# Patient Record
Sex: Female | Born: 1960 | Race: Black or African American | Hispanic: No | Marital: Married | Smoking: Never smoker
Health system: Southern US, Community
[De-identification: ages and names within clinical notes are randomized; demographics above are authoritative.]

## PROBLEM LIST (undated history)

## (undated) DIAGNOSIS — I10 Essential (primary) hypertension: Secondary | ICD-10-CM

## (undated) DIAGNOSIS — E079 Disorder of thyroid, unspecified: Secondary | ICD-10-CM

---

## 1998-05-27 ENCOUNTER — Emergency Department (HOSPITAL_COMMUNITY): Admission: EM | Admit: 1998-05-27 | Discharge: 1998-05-27 | Payer: Self-pay | Admitting: Internal Medicine

## 1998-09-23 ENCOUNTER — Emergency Department (HOSPITAL_COMMUNITY): Admission: EM | Admit: 1998-09-23 | Discharge: 1998-09-23 | Payer: Self-pay | Admitting: Emergency Medicine

## 1999-03-06 ENCOUNTER — Ambulatory Visit (HOSPITAL_COMMUNITY): Admission: RE | Admit: 1999-03-06 | Discharge: 1999-03-06 | Payer: Self-pay | Admitting: Endocrinology

## 1999-03-06 ENCOUNTER — Encounter: Payer: Self-pay | Admitting: Endocrinology

## 2000-08-31 ENCOUNTER — Emergency Department (HOSPITAL_COMMUNITY): Admission: EM | Admit: 2000-08-31 | Discharge: 2000-08-31 | Payer: Self-pay | Admitting: Emergency Medicine

## 2000-08-31 ENCOUNTER — Encounter: Admission: RE | Admit: 2000-08-31 | Discharge: 2000-08-31 | Payer: Self-pay | Admitting: Urology

## 2000-08-31 ENCOUNTER — Encounter: Payer: Self-pay | Admitting: Urology

## 2003-02-16 ENCOUNTER — Ambulatory Visit (HOSPITAL_COMMUNITY): Admission: RE | Admit: 2003-02-16 | Discharge: 2003-02-16 | Payer: Self-pay | Admitting: Otolaryngology

## 2003-02-16 ENCOUNTER — Encounter (INDEPENDENT_AMBULATORY_CARE_PROVIDER_SITE_OTHER): Payer: Self-pay | Admitting: Specialist

## 2003-02-16 ENCOUNTER — Ambulatory Visit (HOSPITAL_BASED_OUTPATIENT_CLINIC_OR_DEPARTMENT_OTHER): Admission: RE | Admit: 2003-02-16 | Discharge: 2003-02-16 | Payer: Self-pay | Admitting: Otolaryngology

## 2005-03-30 ENCOUNTER — Emergency Department (HOSPITAL_COMMUNITY): Admission: EM | Admit: 2005-03-30 | Discharge: 2005-03-30 | Payer: Self-pay | Admitting: Emergency Medicine

## 2005-03-30 ENCOUNTER — Emergency Department (HOSPITAL_COMMUNITY): Admission: EM | Admit: 2005-03-30 | Discharge: 2005-03-31 | Payer: Self-pay | Admitting: Emergency Medicine

## 2007-01-06 ENCOUNTER — Encounter: Admission: RE | Admit: 2007-01-06 | Discharge: 2007-01-06 | Payer: Self-pay | Admitting: Orthopedic Surgery

## 2008-01-04 ENCOUNTER — Encounter: Admission: RE | Admit: 2008-01-04 | Discharge: 2008-01-04 | Payer: Self-pay | Admitting: Endocrinology

## 2010-04-20 ENCOUNTER — Encounter: Payer: Self-pay | Admitting: Endocrinology

## 2010-07-17 ENCOUNTER — Other Ambulatory Visit: Payer: Self-pay | Admitting: Endocrinology

## 2010-07-17 DIAGNOSIS — E010 Iodine-deficiency related diffuse (endemic) goiter: Secondary | ICD-10-CM

## 2010-08-06 ENCOUNTER — Ambulatory Visit
Admission: RE | Admit: 2010-08-06 | Discharge: 2010-08-06 | Disposition: A | Discharge status: Home / Self Care | Payer: BC Managed Care – PPO | Source: Ambulatory Visit | Attending: Endocrinology | Admitting: Endocrinology

## 2010-08-06 DIAGNOSIS — E010 Iodine-deficiency related diffuse (endemic) goiter: Secondary | ICD-10-CM

## 2010-08-15 NOTE — Op Note (Signed)
Christy Harrington, Christy Harrington                        ACCOUNT NO.:  [id number]   MEDICAL RECORD NO.:  [id number]                   PATIENT TYPE:  AMB   LOCATION:  DSC                                  FACILITY:  [facility]   PHYSICIAN:  Christopher E. Ezzard Standing, M.D.         DATE OF BIRTH:  11/10/1960   DATE OF PROCEDURE:  DATE OF DISCHARGE:                                 OPERATIVE REPORT   PREOPERATIVE DIAGNOSIS:  Recurrent tonsillitis with history of chronic  tonsillitis.   POSTOPERATIVE DIAGNOSIS:  Recurrent tonsillitis with history of chronic  tonsillitis.   OPERATION:  Tonsillectomy.   SURGEON:  Kristine Garbe. Ezzard Standing, M.D.   ANESTHESIA:  General endotracheal.   COMPLICATIONS:  None.   BRIEF CLINICAL NOTE:  [name] is a 50 year old female who has had history of  recurrent strep with positive strep cultures.  She frequently gets sore  throats and on examination has 2+ cryptic tonsils with white debris within  tonsillar crypts.  She is taken to the operating room at this time for a  tonsillectomy.   DESCRIPTION OF PROCEDURE:  After adequate endotracheal anesthesia, the  patient received 10 mg of Decadron IV preoperatively, as well as 1 gm of  Ancef IV preoperatively.  The mouth gag was used to expose the oropharynx.  The left and right tonsils were resected from the tonsillar fossas using the  coblator.  Care was taken to preserve the anterior and posterior tonsillar  pillars, as well as the uvula.  Hemostasis was obtained with the coblator.  After obtaining adequate hemostasis, the oropharynx was irrigated with  saline.  [name] was awakened from anesthesia and transferred to recovery  postoperatively doing well.   DISPOSITION:  [name] will be observed in the night recovery care center and  discharged home in the morning on Amoxicillin suspension, 400 mg b.i.d. for  1 week, Tylenol and Lortab elixir, 1-2 tablespoons q. 4 hours p.r.n. pain.  I will have her follow up in my office in  two weeks for recheck.                                               Kristine Garbe. Ezzard Standing, M.D.    CEN/MEDQ  D:  02/16/2003  T:  02/17/2003  Job:  [id number]   cc:   Georgianne Fick, M.D.  [address]  [place]  [place] [address]  Fax: [phone]

## 2011-04-08 ENCOUNTER — Other Ambulatory Visit: Payer: Self-pay | Admitting: Internal Medicine

## 2011-04-08 DIAGNOSIS — M25562 Pain in left knee: Secondary | ICD-10-CM

## 2011-04-16 ENCOUNTER — Ambulatory Visit
Admission: RE | Admit: 2011-04-16 | Discharge: 2011-04-16 | Disposition: A | Discharge status: Home / Self Care | Payer: BC Managed Care – PPO | Source: Ambulatory Visit | Attending: Internal Medicine | Admitting: Internal Medicine

## 2011-04-16 DIAGNOSIS — M25562 Pain in left knee: Secondary | ICD-10-CM

## 2011-12-01 ENCOUNTER — Other Ambulatory Visit: Payer: Self-pay | Admitting: Endocrinology

## 2011-12-01 DIAGNOSIS — E042 Nontoxic multinodular goiter: Secondary | ICD-10-CM

## 2011-12-03 ENCOUNTER — Other Ambulatory Visit: Payer: BC Managed Care – PPO

## 2012-01-07 ENCOUNTER — Other Ambulatory Visit: Payer: BC Managed Care – PPO

## 2012-01-13 ENCOUNTER — Other Ambulatory Visit: Payer: BC Managed Care – PPO

## 2012-01-19 ENCOUNTER — Ambulatory Visit
Admission: RE | Admit: 2012-01-19 | Discharge: 2012-01-19 | Disposition: A | Discharge status: Home / Self Care | Payer: BC Managed Care – PPO | Source: Ambulatory Visit | Attending: Endocrinology | Admitting: Endocrinology

## 2012-01-19 DIAGNOSIS — E042 Nontoxic multinodular goiter: Secondary | ICD-10-CM

## 2015-02-18 ENCOUNTER — Emergency Department (HOSPITAL_COMMUNITY)
Admission: EM | Admit: 2015-02-18 | Discharge: 2015-02-19 | Disposition: A | Discharge status: Home / Self Care | Payer: BLUE CROSS/BLUE SHIELD | Attending: Emergency Medicine | Admitting: Emergency Medicine

## 2015-02-18 ENCOUNTER — Encounter (HOSPITAL_COMMUNITY): Payer: Self-pay

## 2015-02-18 DIAGNOSIS — S161XXA Strain of muscle, fascia and tendon at neck level, initial encounter: Secondary | ICD-10-CM | POA: Insufficient documentation

## 2015-02-18 DIAGNOSIS — S3992XA Unspecified injury of lower back, initial encounter: Secondary | ICD-10-CM | POA: Diagnosis not present

## 2015-02-18 DIAGNOSIS — S199XXA Unspecified injury of neck, initial encounter: Secondary | ICD-10-CM | POA: Diagnosis present

## 2015-02-18 DIAGNOSIS — Y9389 Activity, other specified: Secondary | ICD-10-CM | POA: Diagnosis not present

## 2015-02-18 DIAGNOSIS — I10 Essential (primary) hypertension: Secondary | ICD-10-CM | POA: Insufficient documentation

## 2015-02-18 DIAGNOSIS — Y92410 Unspecified street and highway as the place of occurrence of the external cause: Secondary | ICD-10-CM | POA: Diagnosis not present

## 2015-02-18 DIAGNOSIS — Z8639 Personal history of other endocrine, nutritional and metabolic disease: Secondary | ICD-10-CM | POA: Insufficient documentation

## 2015-02-18 DIAGNOSIS — Y998 Other external cause status: Secondary | ICD-10-CM | POA: Diagnosis not present

## 2015-02-18 DIAGNOSIS — S29012A Strain of muscle and tendon of back wall of thorax, initial encounter: Secondary | ICD-10-CM | POA: Insufficient documentation

## 2015-02-18 DIAGNOSIS — S29019A Strain of muscle and tendon of unspecified wall of thorax, initial encounter: Secondary | ICD-10-CM

## 2015-02-18 HISTORY — DX: Essential (primary) hypertension: I10

## 2015-02-18 HISTORY — DX: Disorder of thyroid, unspecified: E07.9
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
## 2015-02-19 ENCOUNTER — Emergency Department (HOSPITAL_COMMUNITY): Payer: BLUE CROSS/BLUE SHIELD

## 2015-02-19 MED ORDER — IBUPROFEN 800 MG PO TABS
800.0000 mg | ORAL_TABLET | Freq: Once | ORAL | Status: AC
  Administered 2015-02-19: 800 mg via ORAL
  Filled 2015-02-19: qty 1

## 2015-02-19 MED ORDER — NAPROXEN 500 MG PO TABS: 500.0000 mg | ORAL_TABLET | Freq: Two times a day (BID) | ORAL | Status: DC

## 2015-02-19 MED ORDER — METHOCARBAMOL 500 MG PO TABS
500.0000 mg | ORAL_TABLET | Freq: Once | ORAL | Status: AC
  Administered 2015-02-19: 500 mg via ORAL
  Filled 2015-02-19: qty 1

## 2015-02-19 MED ORDER — METHOCARBAMOL 500 MG PO TABS: 500.0000 mg | ORAL_TABLET | Freq: Two times a day (BID) | ORAL | Status: DC
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
## 2015-02-19 NOTE — Discharge Instructions (Signed)
1. Medications: robaxin, naproxyn, usual home medications 2. Treatment: rest, drink plenty of fluids, gentle stretching as discussed, alternate ice and heat 3. Follow Up: Please followup with your primary doctor in 3 days for discussion of your diagnoses and further evaluation after today's visit; if you do not have a primary care doctor use the resource guide provided to find one;  Return to the ER for worsening back pain, difficulty walking, loss of bowel or bladder control or other concerning symptoms    Cervical Sprain A cervical sprain is an injury in the neck in which the strong, fibrous tissues (ligaments) that connect your neck bones stretch or tear. Cervical sprains can range from mild to severe. Severe cervical sprains can cause the neck vertebrae to be unstable. This can lead to damage of the spinal cord and can result in serious nervous system problems. The amount of time it takes for a cervical sprain to get better depends on the cause and extent of the injury. Most cervical sprains heal in 1 to 3 weeks. CAUSES  Severe cervical sprains may be caused by:   Contact sport injuries (such as from football, rugby, wrestling, hockey, auto racing, gymnastics, diving, martial arts, or boxing).   Motor vehicle collisions.   Whiplash injuries. This is an injury from a sudden forward and backward whipping movement of the head and neck.  Falls.  Mild cervical sprains may be caused by:   Being in an awkward position, such as while cradling a telephone between your ear and shoulder.   Sitting in a chair that does not offer proper support.   Working at a poorly Marketing executive station.   Looking up or down for long periods of time.  SYMPTOMS   Pain, soreness, stiffness, or a burning sensation in the front, back, or sides of the neck. This discomfort may develop immediately after the injury or slowly, 24 hours or more after the injury.   Pain or tenderness directly in the middle  of the back of the neck.   Shoulder or upper back pain.   Limited ability to move the neck.   Headache.   Dizziness.   Weakness, numbness, or tingling in the hands or arms.   Muscle spasms.   Difficulty swallowing or chewing.   Tenderness and swelling of the neck.  DIAGNOSIS  Most of the time your health care provider can diagnose a cervical sprain by taking your history and doing a physical exam. Your health care provider will ask about previous neck injuries and any known neck problems, such as arthritis in the neck. X-rays may be taken to find out if there are any other problems, such as with the bones of the neck. Other tests, such as a CT scan or MRI, may also be needed.  TREATMENT  Treatment depends on the severity of the cervical sprain. Mild sprains can be treated with rest, keeping the neck in place (immobilization), and pain medicines. Severe cervical sprains are immediately immobilized. Further treatment is done to help with pain, muscle spasms, and other symptoms and may include:  Medicines, such as pain relievers, numbing medicines, or muscle relaxants.   Physical therapy. This may involve stretching exercises, strengthening exercises, and posture training. Exercises and improved posture can help stabilize the neck, strengthen muscles, and help stop symptoms from returning.  HOME CARE INSTRUCTIONS   Put ice on the injured area.   Put ice in a plastic bag.   Place a towel between your skin and the bag.  Leave the ice on for 15-20 minutes, 3-4 times a day.   If your injury was severe, you may have been given a cervical collar to wear. A cervical collar is a two-piece collar designed to keep your neck from moving while it heals.  Do not remove the collar unless instructed by your health care provider.  If you have long hair, keep it outside of the collar.  Ask your health care provider before making any adjustments to your collar. Minor adjustments  may be required over time to improve comfort and reduce pressure on your chin or on the back of your head.  Ifyou are allowed to remove the collar for cleaning or bathing, follow your health care provider's instructions on how to do so safely.  Keep your collar clean by wiping it with mild soap and water and drying it completely. If the collar you have been given includes removable pads, remove them every 1-2 days and hand wash them with soap and water. Allow them to air dry. They should be completely dry before you wear them in the collar.  If you are allowed to remove the collar for cleaning and bathing, wash and dry the skin of your neck. Check your skin for irritation or sores. If you see any, tell your health care provider.  Do not drive while wearing the collar.   Only take over-the-counter or prescription medicines for pain, discomfort, or fever as directed by your health care provider.   Keep all follow-up appointments as directed by your health care provider.   Keep all physical therapy appointments as directed by your health care provider.   Make any needed adjustments to your workstation to promote good posture.   Avoid positions and activities that make your symptoms worse.   Warm up and stretch before being active to help prevent problems.  SEEK MEDICAL CARE IF:   Your pain is not controlled with medicine.   You are unable to decrease your pain medicine over time as planned.   Your activity level is not improving as expected.  SEEK IMMEDIATE MEDICAL CARE IF:   You develop any bleeding.  You develop stomach upset.  You have signs of an allergic reaction to your medicine.   Your symptoms get worse.   You develop new, unexplained symptoms.   You have numbness, tingling, weakness, or paralysis in any part of your body.  MAKE SURE YOU:   Understand these instructions.  Will watch your condition.  Will get help right away if you are not doing well  or get worse.   This information is not intended to replace advice given to you by your health care provider. Make sure you discuss any questions you have with your health care provider.   Document Released: 01/11/2007 Document Revised: 03/21/2013 Document Reviewed: 09/21/2012 Elsevier Interactive Patient Education 2016 [employer]   Tourist information centre managerMotor Vehicle Collision It is common to have multiple bruises and sore muscles after a motor vehicle collision (MVC). These tend to feel worse for the first 24 hours. You may have the most stiffness and soreness over the first several hours. You may also feel worse when you wake up the first morning after your collision. After this point, you will usually begin to improve with each day. The speed of improvement often depends on the severity of the collision, the number of injuries, and the location and nature of these injuries. HOME CARE INSTRUCTIONS  Put ice on the injured area.  Put ice in a  plastic bag.  Place a towel between your skin and the bag.  Leave the ice on for 15-20 minutes, 3-4 times a day, or as directed by your health care provider.  Drink enough fluids to keep your urine clear or pale yellow. Do not drink alcohol.  Take a warm shower or bath once or twice a day. This will increase blood flow to sore muscles.  You may return to activities as directed by your caregiver. Be careful when lifting, as this may aggravate neck or back pain.  Only take over-the-counter or prescription medicines for pain, discomfort, or fever as directed by your caregiver. Do not use aspirin. This may increase bruising and bleeding. SEEK IMMEDIATE MEDICAL CARE IF:  You have numbness, tingling, or weakness in the arms or legs.  You develop severe headaches not relieved with medicine.  You have severe neck pain, especially tenderness in the middle of the back of your neck.  You have changes in bowel or bladder control.  There is increasing pain in any area of  the body.  You have shortness of breath, light-headedness, dizziness, or fainting.  You have chest pain.  You feel sick to your stomach (nauseous), throw up (vomit), or sweat.  You have increasing abdominal discomfort.  There is blood in your urine, stool, or vomit.  You have pain in your shoulder (shoulder strap areas).  You feel your symptoms are getting worse. MAKE SURE YOU:  Understand these instructions.  Will watch your condition.  Will get help right away if you are not doing well or get worse.   This information is not intended to replace advice given to you by your health care provider. Make sure you discuss any questions you have with your health care provider.   Document Released: 03/16/2005 Document Revised: 04/06/2014 Document Reviewed: 08/13/2010 Elsevier Interactive Patient Education [employer]

## 2015-04-04 ENCOUNTER — Other Ambulatory Visit: Payer: Self-pay | Admitting: Endocrinology

## 2015-04-04 DIAGNOSIS — E049 Nontoxic goiter, unspecified: Secondary | ICD-10-CM

## 2015-04-10 ENCOUNTER — Other Ambulatory Visit: Payer: Self-pay

## 2015-04-10 ENCOUNTER — Ambulatory Visit
Admission: RE | Admit: 2015-04-10 | Discharge: 2015-04-10 | Disposition: A | Discharge status: Home / Self Care | Payer: BLUE CROSS/BLUE SHIELD | Source: Ambulatory Visit | Attending: Endocrinology | Admitting: Endocrinology

## 2015-04-10 DIAGNOSIS — E049 Nontoxic goiter, unspecified: Secondary | ICD-10-CM

## 2015-09-13 ENCOUNTER — Other Ambulatory Visit: Payer: Self-pay | Admitting: Endocrinology

## 2015-09-13 DIAGNOSIS — R140 Abdominal distension (gaseous): Secondary | ICD-10-CM

## 2015-09-13 DIAGNOSIS — R109 Unspecified abdominal pain: Secondary | ICD-10-CM

## 2015-09-23 ENCOUNTER — Ambulatory Visit
Admission: RE | Admit: 2015-09-23 | Discharge: 2015-09-23 | Disposition: A | Discharge status: Home / Self Care | Payer: BLUE CROSS/BLUE SHIELD | Source: Ambulatory Visit | Attending: Endocrinology | Admitting: Endocrinology

## 2015-09-23 DIAGNOSIS — R140 Abdominal distension (gaseous): Secondary | ICD-10-CM

## 2015-09-23 DIAGNOSIS — R109 Unspecified abdominal pain: Secondary | ICD-10-CM

## 2016-02-18 IMAGING — US US SOFT TISSUE HEAD/NECK
1 series · 14 of 25 positions shown · non-contrast
Comparison: Multiple prior, 01/19/2012, 08/06/2010, 01/04/2008

CLINICAL DATA: 54-year-old female with a history of goiter

EXAM:
THYROID ULTRASOUND
TECHNIQUE: Ultrasound examination of the thyroid gland and adjacent soft
tissues was performed.

[Series 1: us soft tissue head/neck · 0.06mm/px · 14 of 36 slices shown]
[im 1/36]
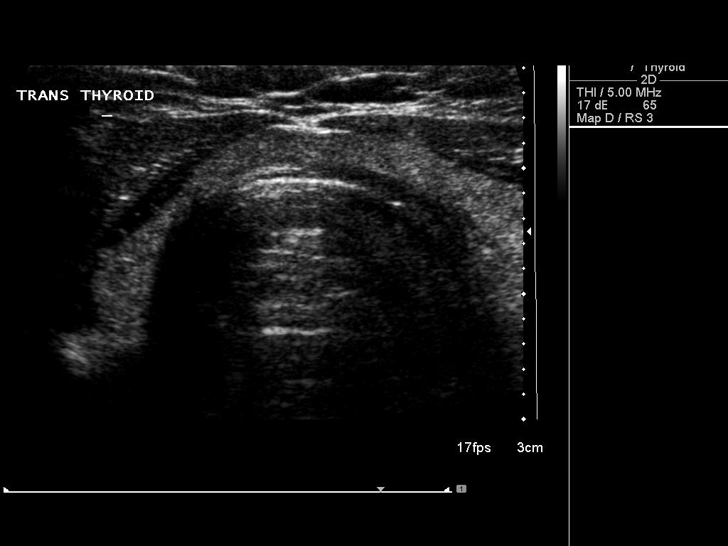
[im 3/36]
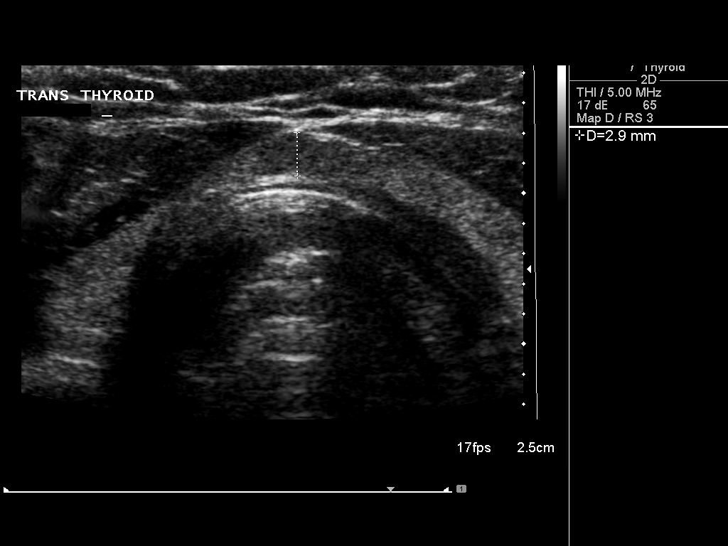
[im 6/36]
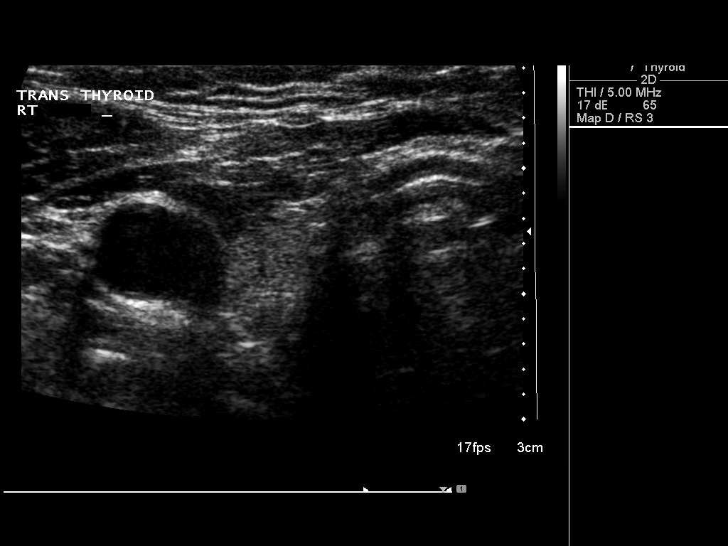
[im 9/36]
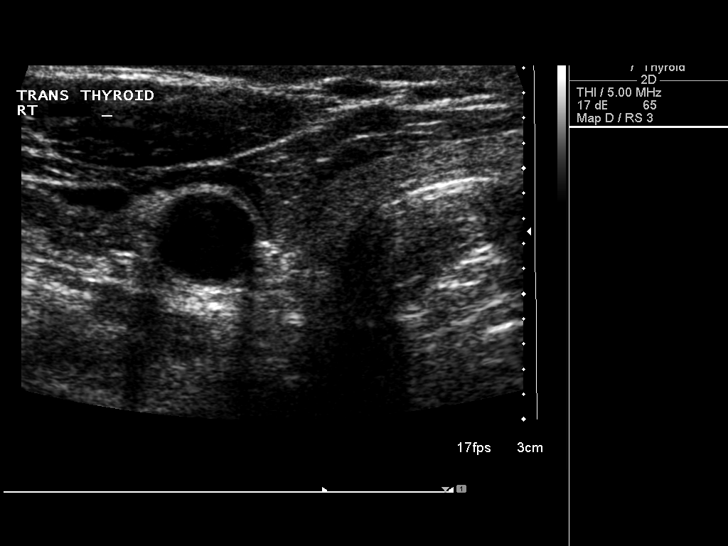
[im 12/36]
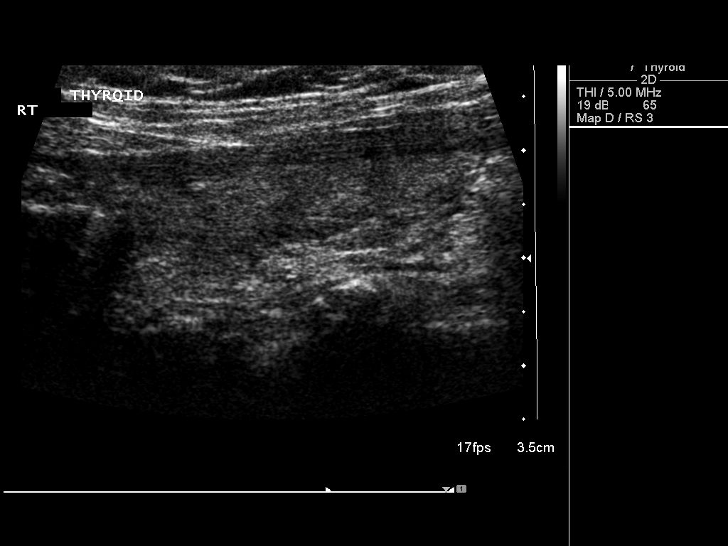
[im 14/36]
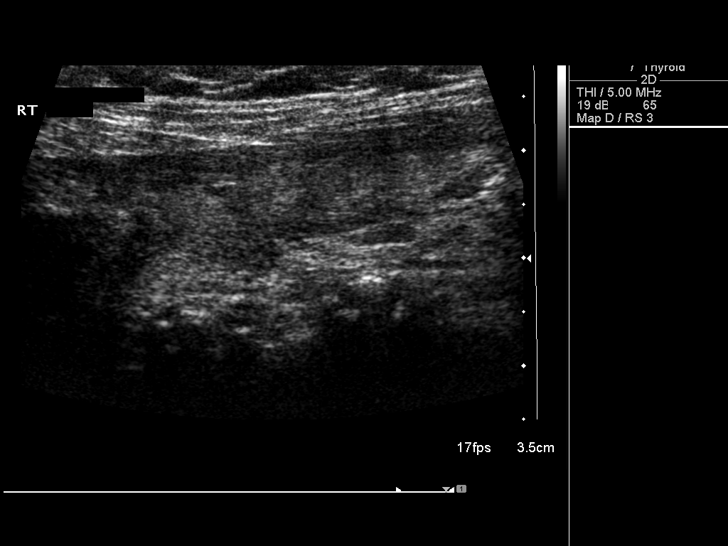
[im 17/36]
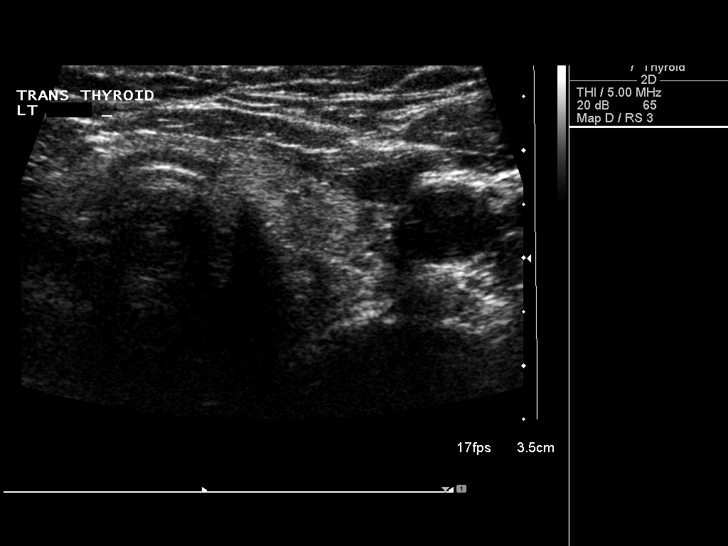
[im 19/36]
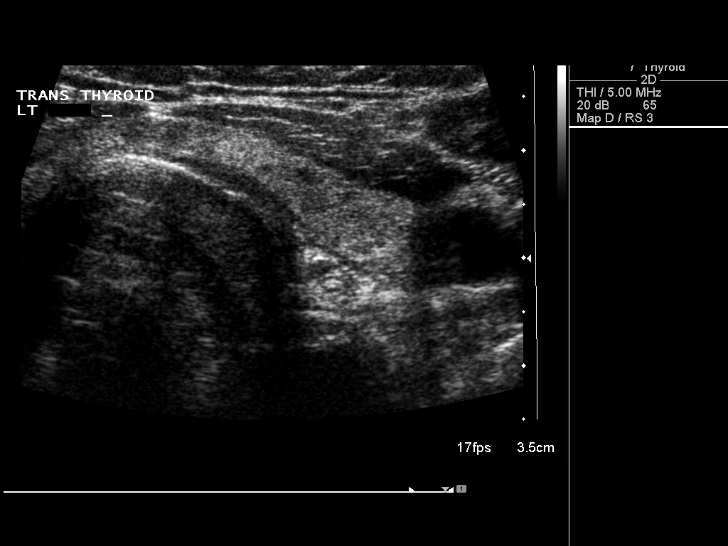
[im 22/36]
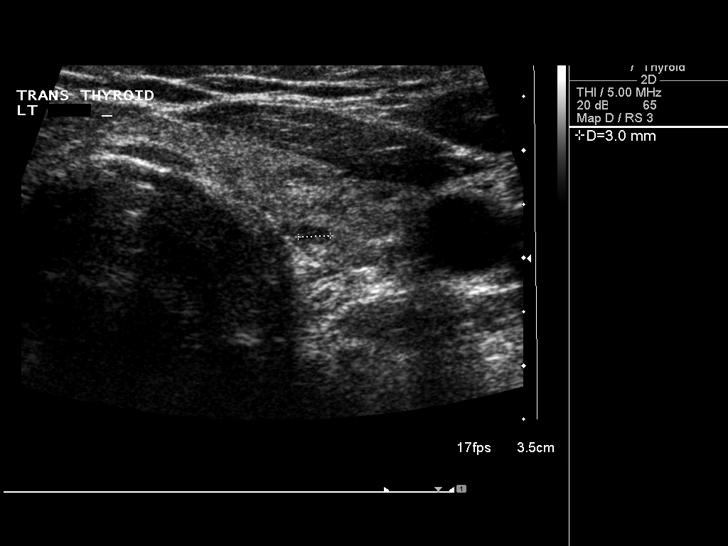
[im 24/36]
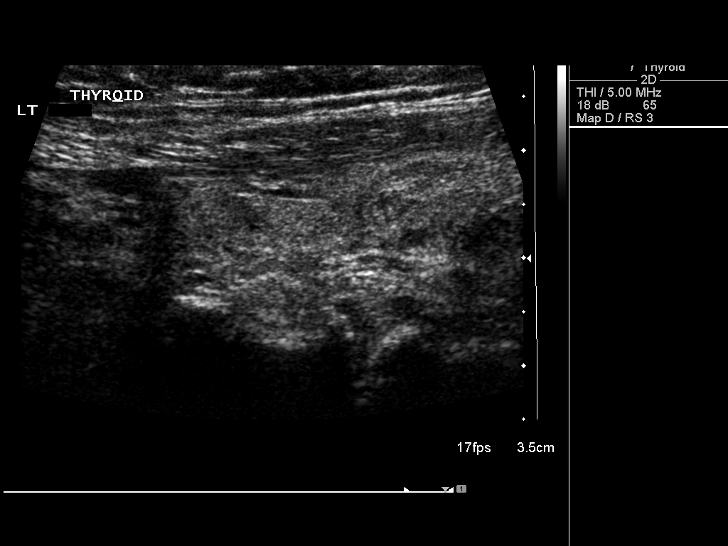
[im 27/36]
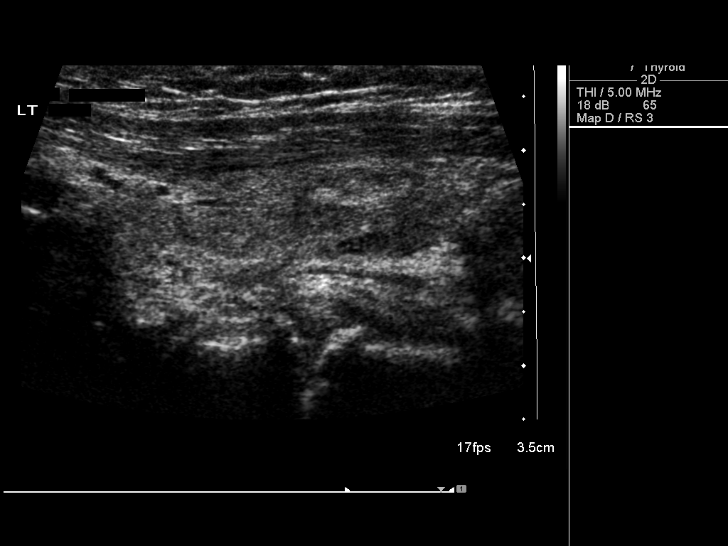
[im 30/36]
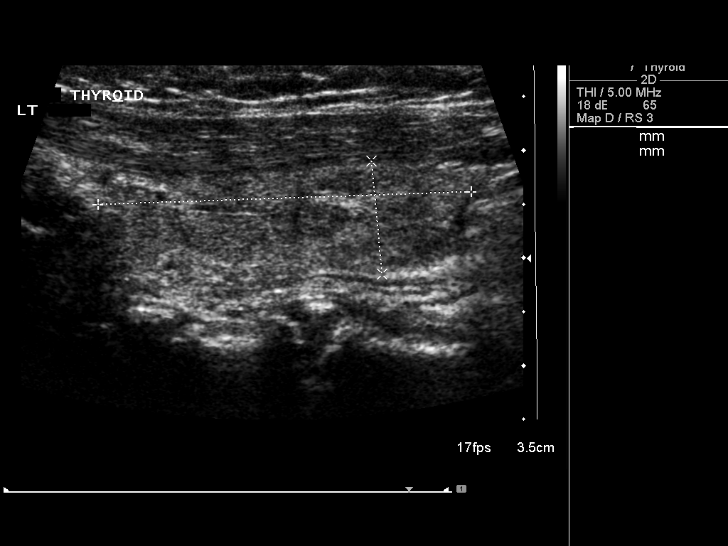
[im 33/36]
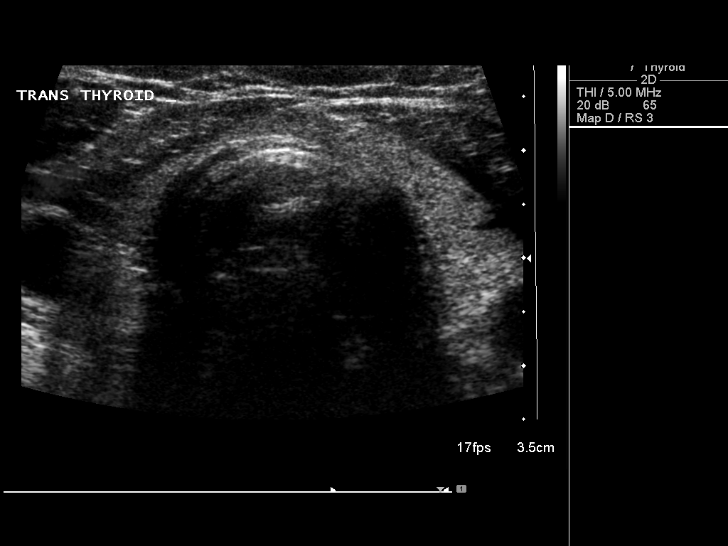
[im 36/36]
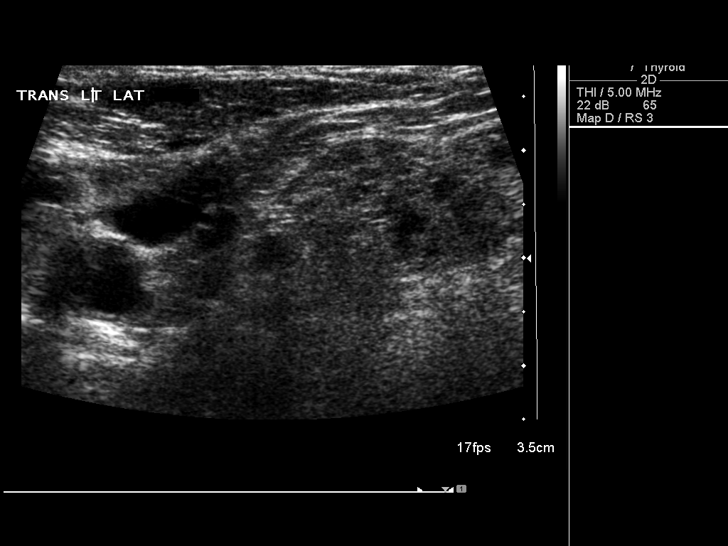

[14 of 25 positions shown; findings below may reference images not displayed]

FINDINGS: Right thyroid lobe

Measurements: 3.2 cm x 9 mm x 8 mm. Heterogeneous appearance of the
right thyroid with relatively increased flow.

Left thyroid lobe

Measurements: 3.5 cm x 1.1 cm x 1.1 cm. Heterogeneous appearance of
the left thyroid with relatively increased flow. Small benign
appearing hypoechoic nodule measuring no more than 5 mm.

Isthmus

Thickness: 3 mm.  No nodules visualized.

Lymphadenopathy

None visualized.
IMPRESSION: Heterogeneous thyroid with relatively increased flow, compatible
with spectrum of thyroiditis.

## 2016-03-25 DIAGNOSIS — Z1211 Encounter for screening for malignant neoplasm of colon: Secondary | ICD-10-CM | POA: Diagnosis not present

## 2016-03-25 DIAGNOSIS — M129 Arthropathy, unspecified: Secondary | ICD-10-CM | POA: Diagnosis not present

## 2016-03-25 DIAGNOSIS — R0781 Pleurodynia: Secondary | ICD-10-CM | POA: Diagnosis not present

## 2016-04-12 DIAGNOSIS — J069 Acute upper respiratory infection, unspecified: Secondary | ICD-10-CM | POA: Diagnosis not present

## 2016-04-22 DIAGNOSIS — E032 Hypothyroidism due to medicaments and other exogenous substances: Secondary | ICD-10-CM | POA: Diagnosis not present

## 2016-04-22 DIAGNOSIS — I10 Essential (primary) hypertension: Secondary | ICD-10-CM | POA: Diagnosis not present

## 2016-05-08 DIAGNOSIS — M19072 Primary osteoarthritis, left ankle and foot: Secondary | ICD-10-CM | POA: Diagnosis not present

## 2016-05-08 DIAGNOSIS — M71572 Other bursitis, not elsewhere classified, left ankle and foot: Secondary | ICD-10-CM | POA: Diagnosis not present

## 2016-05-08 DIAGNOSIS — M84374A Stress fracture, right foot, initial encounter for fracture: Secondary | ICD-10-CM | POA: Diagnosis not present

## 2016-05-08 DIAGNOSIS — M19071 Primary osteoarthritis, right ankle and foot: Secondary | ICD-10-CM | POA: Diagnosis not present

## 2016-05-08 DIAGNOSIS — M7661 Achilles tendinitis, right leg: Secondary | ICD-10-CM | POA: Diagnosis not present

## 2016-05-14 DIAGNOSIS — H66003 Acute suppurative otitis media without spontaneous rupture of ear drum, bilateral: Secondary | ICD-10-CM | POA: Diagnosis not present

## 2016-05-21 DIAGNOSIS — M7661 Achilles tendinitis, right leg: Secondary | ICD-10-CM | POA: Diagnosis not present

## 2016-05-21 DIAGNOSIS — M84374D Stress fracture, right foot, subsequent encounter for fracture with routine healing: Secondary | ICD-10-CM | POA: Diagnosis not present

## 2016-06-03 DIAGNOSIS — M7661 Achilles tendinitis, right leg: Secondary | ICD-10-CM | POA: Diagnosis not present

## 2016-06-03 DIAGNOSIS — M84374D Stress fracture, right foot, subsequent encounter for fracture with routine healing: Secondary | ICD-10-CM | POA: Diagnosis not present

## 2016-06-09 DIAGNOSIS — H66004 Acute suppurative otitis media without spontaneous rupture of ear drum, recurrent, right ear: Secondary | ICD-10-CM | POA: Diagnosis not present

## 2016-06-09 DIAGNOSIS — I10 Essential (primary) hypertension: Secondary | ICD-10-CM | POA: Diagnosis not present

## 2016-06-09 DIAGNOSIS — J0140 Acute pansinusitis, unspecified: Secondary | ICD-10-CM | POA: Diagnosis not present

## 2016-07-03 DIAGNOSIS — M84374D Stress fracture, right foot, subsequent encounter for fracture with routine healing: Secondary | ICD-10-CM | POA: Diagnosis not present

## 2016-07-03 DIAGNOSIS — M7661 Achilles tendinitis, right leg: Secondary | ICD-10-CM | POA: Diagnosis not present

## 2016-07-09 DIAGNOSIS — I10 Essential (primary) hypertension: Secondary | ICD-10-CM | POA: Diagnosis not present

## 2016-08-02 IMAGING — US US ABDOMEN COMPLETE
1 series · 13 of 25 positions shown · non-contrast
Comparison: No recent studies available in PACs

CLINICAL DATA: Upper abdominal pain, bloating, indigestion symptoms
for the past 3-4 weeks

EXAM:
ABDOMEN ULTRASOUND COMPLETE

[Series 1: us abdomen complete · 0.26mm/px · 13 of 74 slices shown]
[im 1/74]
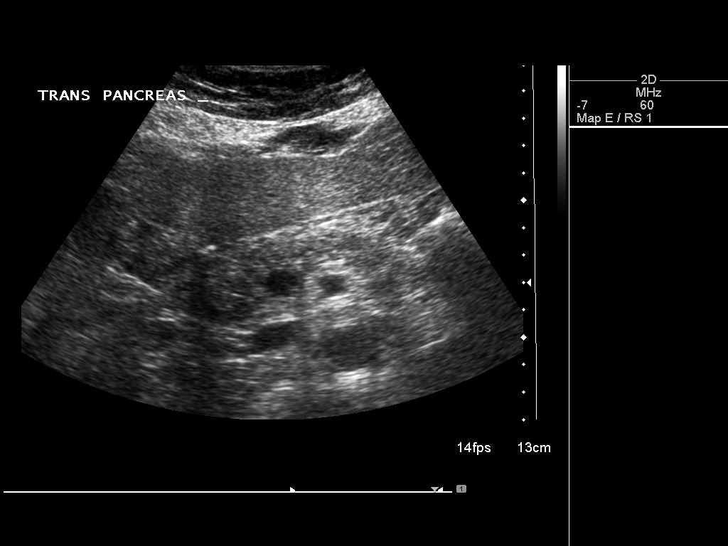
[im 7/74]
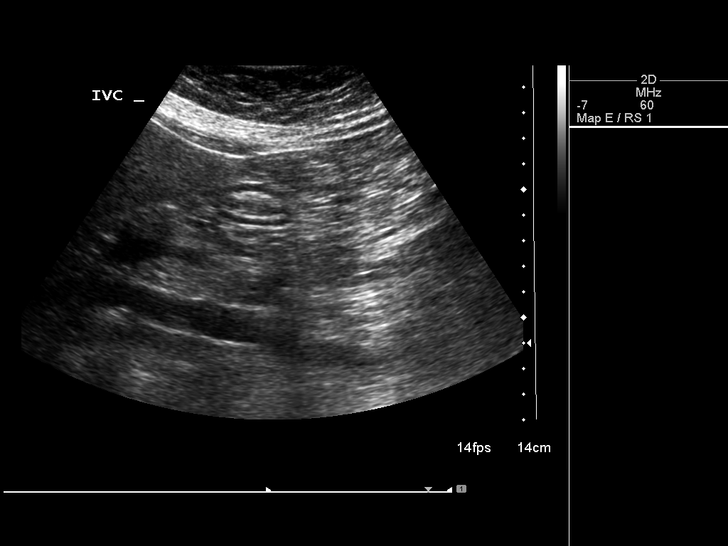
[im 13/74]
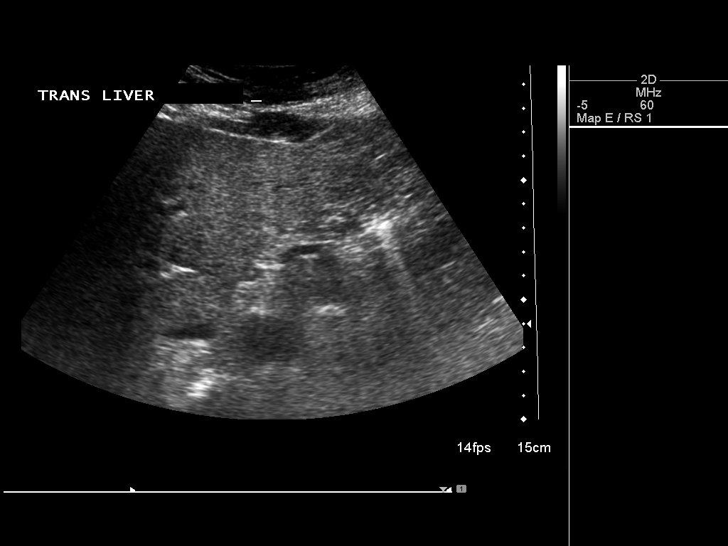
[im 19/74]
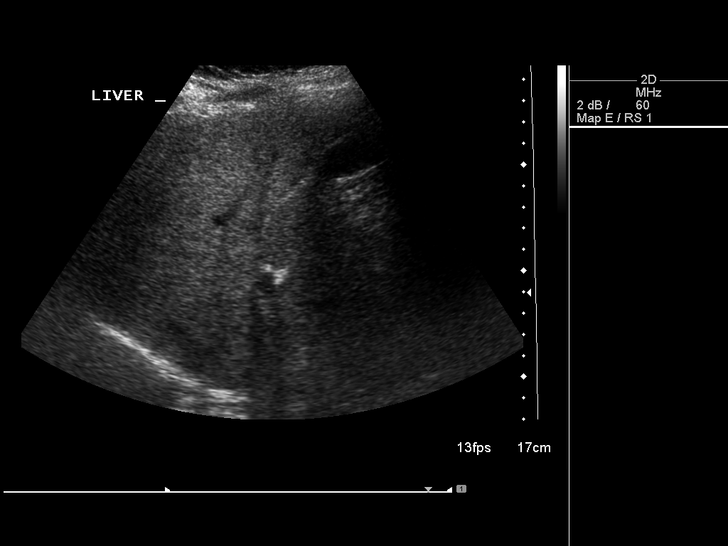
[im 25/74]
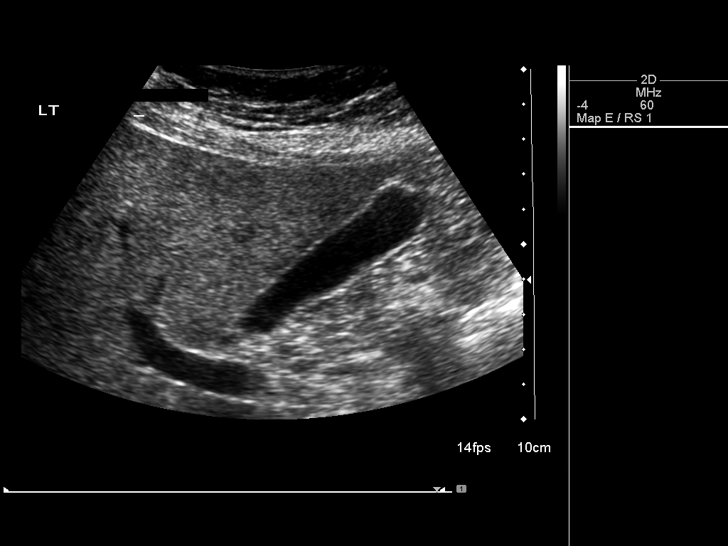
[im 31/74]
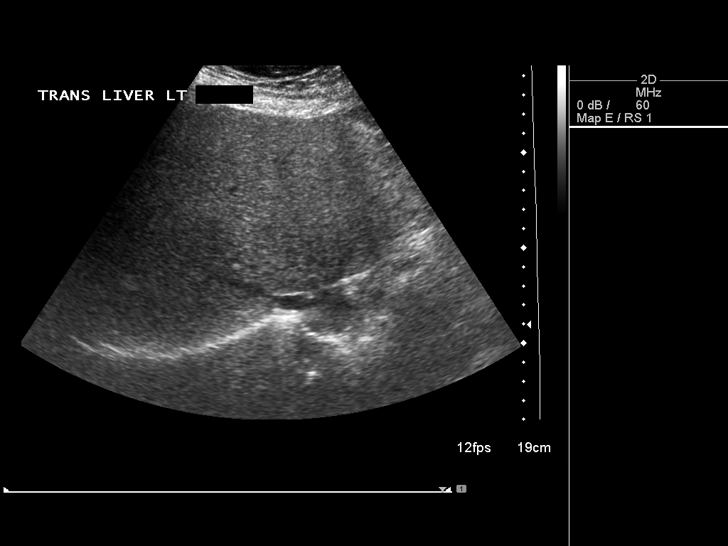
[im 37/74]
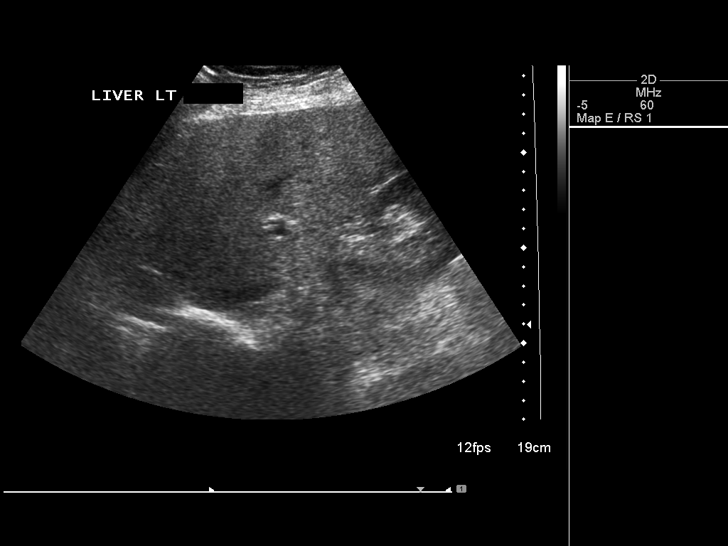
[im 43/74]
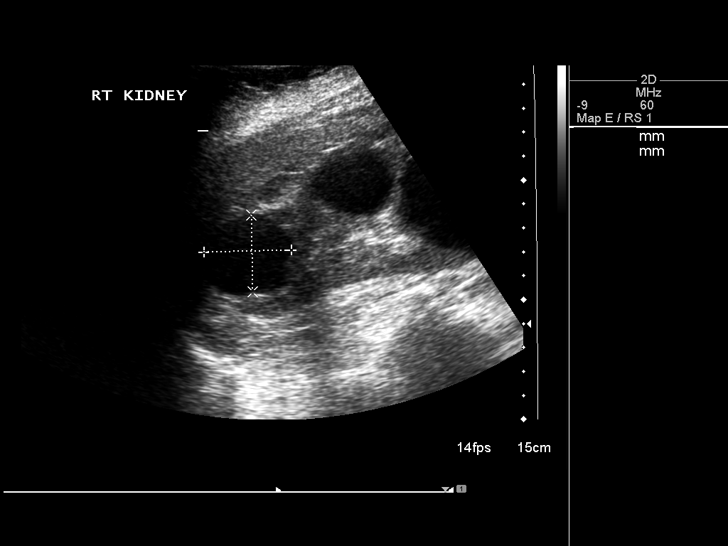
[im 49/74]
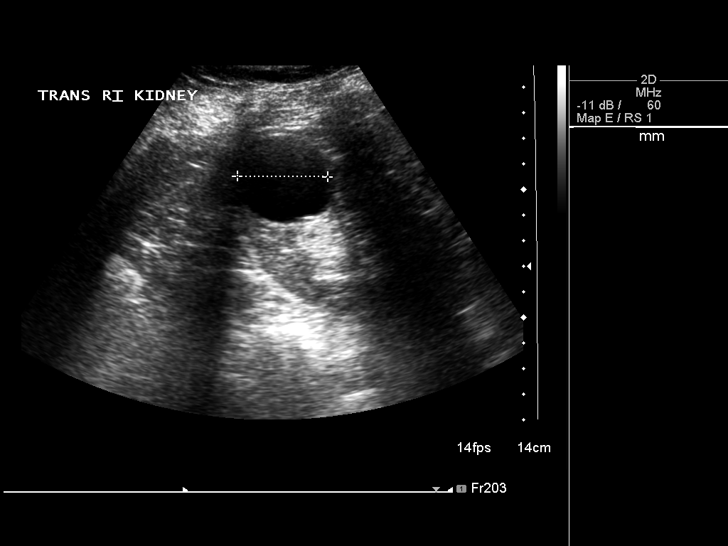
[im 55/74]
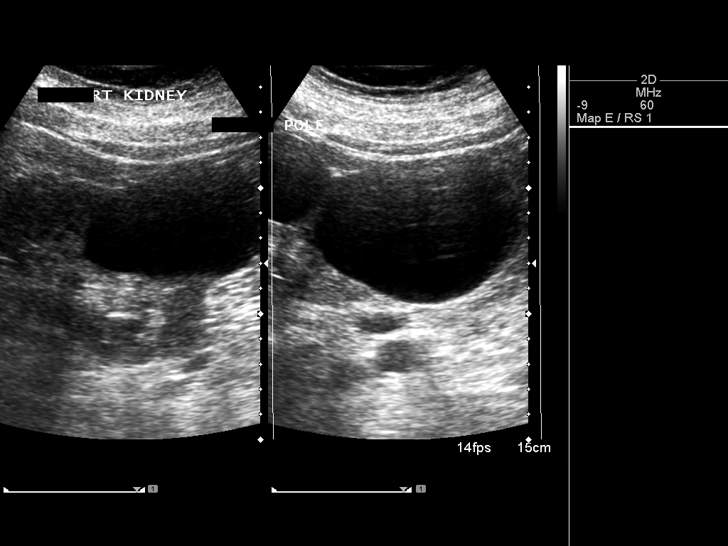
[im 61/74]
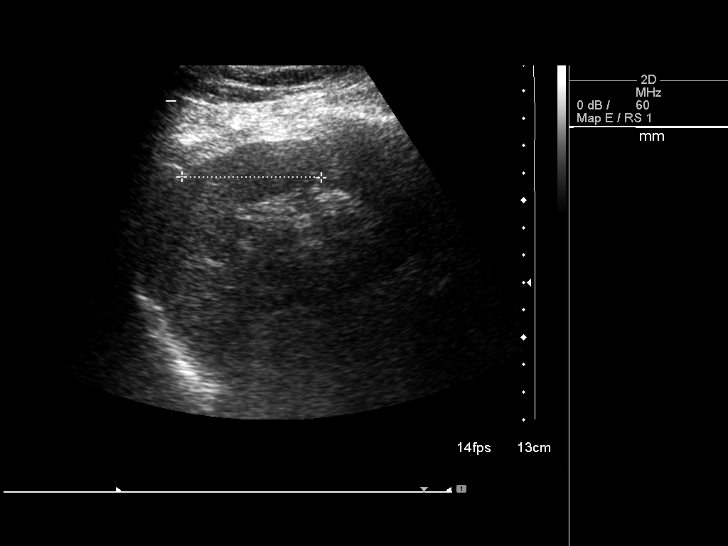
[im 67/74]
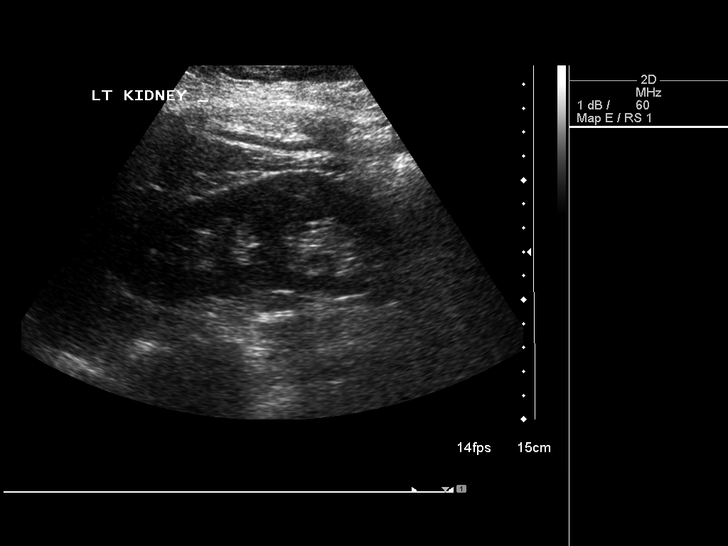
[im 74/74]
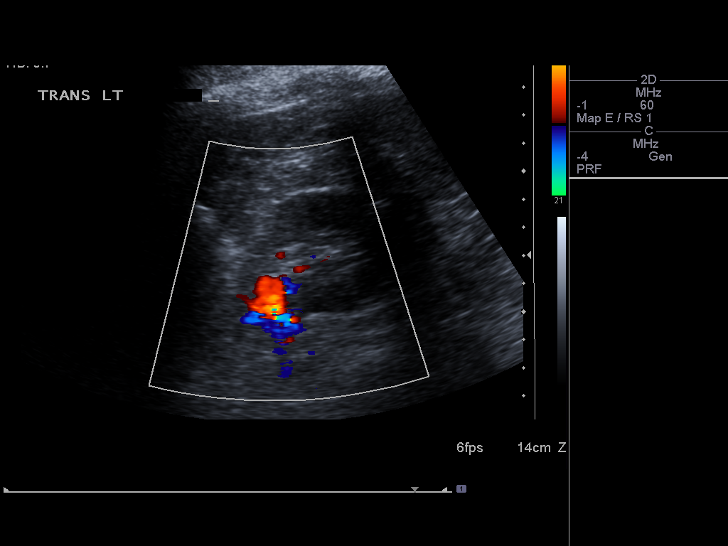

[13 of 25 positions shown; findings below may reference images not displayed]

FINDINGS: Gallbladder: No gallstones or wall thickening visualized. No
sonographic Murphy sign noted by sonographer.

Common bile duct: Diameter: 3.1 mm

Liver: The hepatic echotexture is mildly increased diffusely. There
is no focal mass nor ductal dilation. The surface contour of the
liver appears smooth.

IVC: No abnormality visualized.

Pancreas: Bowel gas limits evaluation of the pancreatic tail. The
pancreatic head and body appear normal.

Spleen: Size and appearance within normal limits.

Right Kidney: Length: 14.0. There are cysts within the right kidney.
In the upper pole there is a 3.7 cm maximal dimension cyst. In the
midpole there is a 3.9 cm maximal dimension cyst. In the lower pole
there is an 8.8 cm maximal dimension cyst.

Left Kidney: Length: 13.4 cm. Echogenicity within normal limits. No
mass or hydronephrosis visualized.

Abdominal aorta: No aneurysm visualized.

Other findings: There is no ascites.
IMPRESSION: 1. No gallstones or sonographic evidence of acute cholecystitis. If
there are clinical concerns of gallbladder dysfunction, a nuclear
medicine hepatobiliary scan may be useful.
2. Mildly increased hepatic echotexture suggests fatty infiltrative
change. Limited visualization of the pancreatic tail.
3. Simple appearing cysts in the right kidney.

## 2016-08-31 DIAGNOSIS — H53413 Scotoma involving central area, bilateral: Secondary | ICD-10-CM | POA: Diagnosis not present

## 2016-11-09 DIAGNOSIS — E039 Hypothyroidism, unspecified: Secondary | ICD-10-CM | POA: Diagnosis not present

## 2016-12-02 DIAGNOSIS — J018 Other acute sinusitis: Secondary | ICD-10-CM | POA: Diagnosis not present

## 2016-12-02 DIAGNOSIS — I10 Essential (primary) hypertension: Secondary | ICD-10-CM | POA: Diagnosis not present

## 2017-01-11 DIAGNOSIS — J018 Other acute sinusitis: Secondary | ICD-10-CM | POA: Diagnosis not present

## 2017-03-29 DIAGNOSIS — H66002 Acute suppurative otitis media without spontaneous rupture of ear drum, left ear: Secondary | ICD-10-CM | POA: Diagnosis not present

## 2017-03-29 DIAGNOSIS — A09 Infectious gastroenteritis and colitis, unspecified: Secondary | ICD-10-CM | POA: Diagnosis not present

## 2017-04-06 DIAGNOSIS — H66005 Acute suppurative otitis media without spontaneous rupture of ear drum, recurrent, left ear: Secondary | ICD-10-CM | POA: Diagnosis not present

## 2017-04-09 DIAGNOSIS — I10 Essential (primary) hypertension: Secondary | ICD-10-CM | POA: Diagnosis not present

## 2017-04-29 DIAGNOSIS — E039 Hypothyroidism, unspecified: Secondary | ICD-10-CM | POA: Diagnosis not present

## 2017-04-29 DIAGNOSIS — I10 Essential (primary) hypertension: Secondary | ICD-10-CM | POA: Diagnosis not present

## 2017-05-07 DIAGNOSIS — R110 Nausea: Secondary | ICD-10-CM | POA: Diagnosis not present

## 2017-05-07 DIAGNOSIS — I10 Essential (primary) hypertension: Secondary | ICD-10-CM | POA: Diagnosis not present

## 2017-05-12 DIAGNOSIS — E032 Hypothyroidism due to medicaments and other exogenous substances: Secondary | ICD-10-CM | POA: Diagnosis not present

## 2017-05-12 DIAGNOSIS — Z8639 Personal history of other endocrine, nutritional and metabolic disease: Secondary | ICD-10-CM | POA: Diagnosis not present

## 2017-05-17 DIAGNOSIS — I10 Essential (primary) hypertension: Secondary | ICD-10-CM | POA: Diagnosis not present

## 2017-06-04 DIAGNOSIS — I10 Essential (primary) hypertension: Secondary | ICD-10-CM | POA: Diagnosis not present

## 2017-06-04 DIAGNOSIS — R110 Nausea: Secondary | ICD-10-CM | POA: Diagnosis not present

## 2017-06-10 DIAGNOSIS — H9203 Otalgia, bilateral: Secondary | ICD-10-CM | POA: Diagnosis not present

## 2017-06-10 DIAGNOSIS — J029 Acute pharyngitis, unspecified: Secondary | ICD-10-CM | POA: Diagnosis not present

## 2017-06-10 DIAGNOSIS — H6123 Impacted cerumen, bilateral: Secondary | ICD-10-CM | POA: Diagnosis not present

## 2017-06-29 DIAGNOSIS — J209 Acute bronchitis, unspecified: Secondary | ICD-10-CM | POA: Diagnosis not present

## 2017-10-21 DIAGNOSIS — M25562 Pain in left knee: Secondary | ICD-10-CM | POA: Diagnosis not present

## 2017-10-21 DIAGNOSIS — M150 Primary generalized (osteo)arthritis: Secondary | ICD-10-CM | POA: Diagnosis not present

## 2017-10-21 DIAGNOSIS — Z6829 Body mass index (BMI) 29.0-29.9, adult: Secondary | ICD-10-CM | POA: Diagnosis not present

## 2017-11-01 DIAGNOSIS — J0140 Acute pansinusitis, unspecified: Secondary | ICD-10-CM | POA: Diagnosis not present

## 2017-11-05 DIAGNOSIS — E032 Hypothyroidism due to medicaments and other exogenous substances: Secondary | ICD-10-CM | POA: Diagnosis not present

## 2017-11-10 DIAGNOSIS — Z8639 Personal history of other endocrine, nutritional and metabolic disease: Secondary | ICD-10-CM | POA: Diagnosis not present

## 2017-11-10 DIAGNOSIS — E032 Hypothyroidism due to medicaments and other exogenous substances: Secondary | ICD-10-CM | POA: Diagnosis not present

## 2017-12-08 DIAGNOSIS — J029 Acute pharyngitis, unspecified: Secondary | ICD-10-CM | POA: Diagnosis not present

## 2017-12-08 DIAGNOSIS — I10 Essential (primary) hypertension: Secondary | ICD-10-CM | POA: Diagnosis not present

## 2017-12-08 DIAGNOSIS — R110 Nausea: Secondary | ICD-10-CM | POA: Diagnosis not present

## 2018-01-27 DIAGNOSIS — N390 Urinary tract infection, site not specified: Secondary | ICD-10-CM | POA: Diagnosis not present

## 2018-01-27 DIAGNOSIS — Z0000 Encounter for general adult medical examination without abnormal findings: Secondary | ICD-10-CM | POA: Diagnosis not present

## 2018-01-27 DIAGNOSIS — I10 Essential (primary) hypertension: Secondary | ICD-10-CM | POA: Diagnosis not present

## 2018-02-01 DIAGNOSIS — I10 Essential (primary) hypertension: Secondary | ICD-10-CM | POA: Diagnosis not present

## 2018-02-01 DIAGNOSIS — M797 Fibromyalgia: Secondary | ICD-10-CM | POA: Diagnosis not present

## 2018-02-01 DIAGNOSIS — Z0000 Encounter for general adult medical examination without abnormal findings: Secondary | ICD-10-CM | POA: Diagnosis not present

## 2018-02-01 DIAGNOSIS — Z23 Encounter for immunization: Secondary | ICD-10-CM | POA: Diagnosis not present

## 2018-02-01 DIAGNOSIS — E039 Hypothyroidism, unspecified: Secondary | ICD-10-CM | POA: Diagnosis not present

## 2018-03-09 DIAGNOSIS — J209 Acute bronchitis, unspecified: Secondary | ICD-10-CM | POA: Diagnosis not present

## 2018-03-09 DIAGNOSIS — I10 Essential (primary) hypertension: Secondary | ICD-10-CM | POA: Diagnosis not present

## 2018-03-09 DIAGNOSIS — R05 Cough: Secondary | ICD-10-CM | POA: Diagnosis not present

## 2018-04-20 DIAGNOSIS — J019 Acute sinusitis, unspecified: Secondary | ICD-10-CM | POA: Diagnosis not present

## 2018-04-20 DIAGNOSIS — J069 Acute upper respiratory infection, unspecified: Secondary | ICD-10-CM | POA: Diagnosis not present

## 2018-04-27 DIAGNOSIS — J019 Acute sinusitis, unspecified: Secondary | ICD-10-CM | POA: Diagnosis not present

## 2018-04-27 DIAGNOSIS — J069 Acute upper respiratory infection, unspecified: Secondary | ICD-10-CM | POA: Diagnosis not present

## 2018-05-13 DIAGNOSIS — E032 Hypothyroidism due to medicaments and other exogenous substances: Secondary | ICD-10-CM | POA: Diagnosis not present

## 2018-05-18 DIAGNOSIS — Z8639 Personal history of other endocrine, nutritional and metabolic disease: Secondary | ICD-10-CM | POA: Diagnosis not present

## 2018-05-18 DIAGNOSIS — E032 Hypothyroidism due to medicaments and other exogenous substances: Secondary | ICD-10-CM | POA: Diagnosis not present

## 2018-08-29 DIAGNOSIS — H66005 Acute suppurative otitis media without spontaneous rupture of ear drum, recurrent, left ear: Secondary | ICD-10-CM | POA: Diagnosis not present

## 2018-08-29 DIAGNOSIS — I10 Essential (primary) hypertension: Secondary | ICD-10-CM | POA: Diagnosis not present

## 2018-09-08 DIAGNOSIS — E876 Hypokalemia: Secondary | ICD-10-CM | POA: Diagnosis not present

## 2018-09-12 DIAGNOSIS — I10 Essential (primary) hypertension: Secondary | ICD-10-CM | POA: Diagnosis not present

## 2018-09-12 DIAGNOSIS — H66005 Acute suppurative otitis media without spontaneous rupture of ear drum, recurrent, left ear: Secondary | ICD-10-CM | POA: Diagnosis not present

## 2018-10-10 DIAGNOSIS — H66005 Acute suppurative otitis media without spontaneous rupture of ear drum, recurrent, left ear: Secondary | ICD-10-CM | POA: Diagnosis not present

## 2018-10-10 DIAGNOSIS — I10 Essential (primary) hypertension: Secondary | ICD-10-CM | POA: Diagnosis not present

## 2018-11-11 DIAGNOSIS — E032 Hypothyroidism due to medicaments and other exogenous substances: Secondary | ICD-10-CM | POA: Diagnosis not present

## 2018-11-17 DIAGNOSIS — Z8639 Personal history of other endocrine, nutritional and metabolic disease: Secondary | ICD-10-CM | POA: Diagnosis not present

## 2018-11-17 DIAGNOSIS — E032 Hypothyroidism due to medicaments and other exogenous substances: Secondary | ICD-10-CM | POA: Diagnosis not present

## 2018-12-15 DIAGNOSIS — M25562 Pain in left knee: Secondary | ICD-10-CM | POA: Diagnosis not present

## 2018-12-15 DIAGNOSIS — M150 Primary generalized (osteo)arthritis: Secondary | ICD-10-CM | POA: Diagnosis not present

## 2019-04-05 DIAGNOSIS — J019 Acute sinusitis, unspecified: Secondary | ICD-10-CM | POA: Diagnosis not present

## 2019-06-01 DIAGNOSIS — Z8639 Personal history of other endocrine, nutritional and metabolic disease: Secondary | ICD-10-CM | POA: Diagnosis not present

## 2019-06-01 DIAGNOSIS — E032 Hypothyroidism due to medicaments and other exogenous substances: Secondary | ICD-10-CM | POA: Diagnosis not present

## 2019-06-29 DIAGNOSIS — J0101 Acute recurrent maxillary sinusitis: Secondary | ICD-10-CM | POA: Diagnosis not present

## 2019-06-29 DIAGNOSIS — H66006 Acute suppurative otitis media without spontaneous rupture of ear drum, recurrent, bilateral: Secondary | ICD-10-CM | POA: Diagnosis not present

## 2019-08-03 DIAGNOSIS — M150 Primary generalized (osteo)arthritis: Secondary | ICD-10-CM | POA: Diagnosis not present

## 2019-08-03 DIAGNOSIS — M25562 Pain in left knee: Secondary | ICD-10-CM | POA: Diagnosis not present

## 2019-10-28 DIAGNOSIS — Z20822 Contact with and (suspected) exposure to covid-19: Secondary | ICD-10-CM | POA: Diagnosis not present

## 2019-11-30 DIAGNOSIS — E032 Hypothyroidism due to medicaments and other exogenous substances: Secondary | ICD-10-CM | POA: Diagnosis not present

## 2019-12-12 DIAGNOSIS — Z8639 Personal history of other endocrine, nutritional and metabolic disease: Secondary | ICD-10-CM | POA: Diagnosis not present

## 2019-12-12 DIAGNOSIS — E032 Hypothyroidism due to medicaments and other exogenous substances: Secondary | ICD-10-CM | POA: Diagnosis not present

## 2020-01-31 DIAGNOSIS — E032 Hypothyroidism due to medicaments and other exogenous substances: Secondary | ICD-10-CM | POA: Diagnosis not present

## 2020-01-31 DIAGNOSIS — R7309 Other abnormal glucose: Secondary | ICD-10-CM | POA: Diagnosis not present

## 2020-01-31 DIAGNOSIS — Z0000 Encounter for general adult medical examination without abnormal findings: Secondary | ICD-10-CM | POA: Diagnosis not present

## 2020-01-31 DIAGNOSIS — I10 Essential (primary) hypertension: Secondary | ICD-10-CM | POA: Diagnosis not present

## 2020-02-06 DIAGNOSIS — R300 Dysuria: Secondary | ICD-10-CM | POA: Diagnosis not present

## 2020-02-13 DIAGNOSIS — R059 Cough, unspecified: Secondary | ICD-10-CM | POA: Diagnosis not present

## 2020-02-13 DIAGNOSIS — R300 Dysuria: Secondary | ICD-10-CM | POA: Diagnosis not present

## 2020-02-13 DIAGNOSIS — I10 Essential (primary) hypertension: Secondary | ICD-10-CM | POA: Diagnosis not present

## 2020-02-13 DIAGNOSIS — Z9109 Other allergy status, other than to drugs and biological substances: Secondary | ICD-10-CM | POA: Diagnosis not present

## 2020-02-13 DIAGNOSIS — Z0000 Encounter for general adult medical examination without abnormal findings: Secondary | ICD-10-CM | POA: Diagnosis not present

## 2020-05-08 DIAGNOSIS — M25562 Pain in left knee: Secondary | ICD-10-CM | POA: Diagnosis not present

## 2020-05-08 DIAGNOSIS — M150 Primary generalized (osteo)arthritis: Secondary | ICD-10-CM | POA: Diagnosis not present

## 2020-05-14 ENCOUNTER — Ambulatory Visit: Payer: BC Managed Care – PPO

## 2020-05-14 ENCOUNTER — Other Ambulatory Visit: Payer: Self-pay

## 2020-05-14 ENCOUNTER — Ambulatory Visit (INDEPENDENT_AMBULATORY_CARE_PROVIDER_SITE_OTHER): Payer: BC Managed Care – PPO | Admitting: Podiatry

## 2020-05-14 DIAGNOSIS — M79671 Pain in right foot: Secondary | ICD-10-CM

## 2020-05-14 DIAGNOSIS — M216X9 Other acquired deformities of unspecified foot: Secondary | ICD-10-CM

## 2020-05-14 DIAGNOSIS — M722 Plantar fascial fibromatosis: Secondary | ICD-10-CM | POA: Diagnosis not present

## 2020-05-14 DIAGNOSIS — M79672 Pain in left foot: Secondary | ICD-10-CM

## 2020-05-14 NOTE — Patient Instructions (Signed)

## 2020-06-11 ENCOUNTER — Ambulatory Visit (INDEPENDENT_AMBULATORY_CARE_PROVIDER_SITE_OTHER): Payer: BC Managed Care – PPO | Admitting: Podiatry

## 2020-06-11 DIAGNOSIS — Z5329 Procedure and treatment not carried out because of patient's decision for other reasons: Secondary | ICD-10-CM

## 2020-06-11 NOTE — Progress Notes (Signed)
No show for appt. 

## 2020-06-27 DIAGNOSIS — I10 Essential (primary) hypertension: Secondary | ICD-10-CM | POA: Diagnosis not present

## 2020-06-27 DIAGNOSIS — E032 Hypothyroidism due to medicaments and other exogenous substances: Secondary | ICD-10-CM | POA: Diagnosis not present

## 2020-07-04 DIAGNOSIS — E032 Hypothyroidism due to medicaments and other exogenous substances: Secondary | ICD-10-CM | POA: Diagnosis not present

## 2020-07-04 DIAGNOSIS — Z8639 Personal history of other endocrine, nutritional and metabolic disease: Secondary | ICD-10-CM | POA: Diagnosis not present

## 2020-07-21 ENCOUNTER — Encounter (HOSPITAL_COMMUNITY): Payer: Self-pay | Admitting: Emergency Medicine

## 2020-07-21 ENCOUNTER — Other Ambulatory Visit: Payer: Self-pay

## 2020-07-21 ENCOUNTER — Emergency Department (HOSPITAL_COMMUNITY): Payer: BLUE CROSS/BLUE SHIELD

## 2020-07-21 ENCOUNTER — Emergency Department (HOSPITAL_COMMUNITY)
Admission: EM | Admit: 2020-07-21 | Discharge: 2020-07-21 | Disposition: A | Discharge status: Home / Self Care | Payer: BLUE CROSS/BLUE SHIELD | Attending: Emergency Medicine | Admitting: Emergency Medicine

## 2020-07-21 DIAGNOSIS — T7840XA Allergy, unspecified, initial encounter: Secondary | ICD-10-CM | POA: Insufficient documentation

## 2020-07-21 DIAGNOSIS — L509 Urticaria, unspecified: Secondary | ICD-10-CM | POA: Insufficient documentation

## 2020-07-21 DIAGNOSIS — I10 Essential (primary) hypertension: Secondary | ICD-10-CM | POA: Insufficient documentation

## 2020-07-21 DIAGNOSIS — R110 Nausea: Secondary | ICD-10-CM | POA: Insufficient documentation

## 2020-07-21 DIAGNOSIS — X58XXXA Exposure to other specified factors, initial encounter: Secondary | ICD-10-CM | POA: Insufficient documentation

## 2020-07-21 DIAGNOSIS — J029 Acute pharyngitis, unspecified: Secondary | ICD-10-CM | POA: Diagnosis not present

## 2020-07-21 DIAGNOSIS — E876 Hypokalemia: Secondary | ICD-10-CM | POA: Diagnosis not present

## 2020-07-21 DIAGNOSIS — R0602 Shortness of breath: Secondary | ICD-10-CM | POA: Insufficient documentation

## 2020-07-21 DIAGNOSIS — Z79899 Other long term (current) drug therapy: Secondary | ICD-10-CM | POA: Insufficient documentation

## 2020-07-21 LAB — CBC WITH DIFFERENTIAL/PLATELET
Abs Immature Granulocytes: 0.01 10*3/uL (ref 0.00–0.07)
Basophils Absolute: 0 10*3/uL (ref 0.0–0.1)
Basophils Relative: 1 %
Eosinophils Absolute: 0.1 10*3/uL (ref 0.0–0.5)
Eosinophils Relative: 2 %
HCT: 42.4 % (ref 36.0–46.0)
Hemoglobin: 14.6 g/dL (ref 12.0–15.0)
Immature Granulocytes: 0 %
Lymphocytes Relative: 47 %
Lymphs Abs: 2.5 10*3/uL (ref 0.7–4.0)
MCH: 29.6 pg (ref 26.0–34.0)
MCHC: 34.4 g/dL (ref 30.0–36.0)
MCV: 86 fL (ref 80.0–100.0)
Monocytes Absolute: 0.3 10*3/uL (ref 0.1–1.0)
Monocytes Relative: 6 %
Neutro Abs: 2.3 10*3/uL (ref 1.7–7.7)
Neutrophils Relative %: 44 %
Platelets: 374 10*3/uL (ref 150–400)
RBC: 4.93 MIL/uL (ref 3.87–5.11)
RDW: 11.8 % (ref 11.5–15.5)
WBC: 5.2 10*3/uL (ref 4.0–10.5)
nRBC: 0 % (ref 0.0–0.2)

## 2020-07-21 LAB — BASIC METABOLIC PANEL
Anion gap: 10 (ref 5–15)
BUN: 5 mg/dL — ABNORMAL LOW (ref 8–23)
CO2: 25 mmol/L (ref 22–32)
Calcium: 10.3 mg/dL (ref 8.9–10.3)
Chloride: 106 mmol/L (ref 98–111)
Creatinine, Ser: 0.65 mg/dL (ref 0.44–1.00)
GFR, Estimated: >60 mL/min (ref >60)
Glucose, Bld: 99 mg/dL (ref 70–99)
Potassium: 3 mmol/L — ABNORMAL LOW (ref 3.5–5.1)
Sodium: 141 mmol/L (ref 135–145)

## 2020-07-21 MED ORDER — ONDANSETRON HCL 4 MG/2ML IJ SOLN
4.0000 mg | Freq: Once | INTRAMUSCULAR | Status: AC
  Administered 2020-07-21: 4 mg via INTRAVENOUS
  Filled 2020-07-21: qty 2

## 2020-07-21 MED ORDER — METHYLPREDNISOLONE SODIUM SUCC 125 MG IJ SOLR
125.0000 mg | Freq: Once | INTRAMUSCULAR | Status: AC
  Administered 2020-07-21: 125 mg via INTRAVENOUS
  Filled 2020-07-21: qty 2

## 2020-07-21 MED ORDER — SODIUM CHLORIDE 0.9 % IV BOLUS
1000.0000 mL | Freq: Once | INTRAVENOUS | Status: AC
  Administered 2020-07-21: 1000 mL via INTRAVENOUS

## 2020-07-21 MED ORDER — POTASSIUM CHLORIDE CRYS ER 20 MEQ PO TBCR
40.0000 meq | EXTENDED_RELEASE_TABLET | Freq: Once | ORAL | Status: AC
  Administered 2020-07-21: 40 meq via ORAL
  Filled 2020-07-21: qty 2

## 2020-07-21 MED ORDER — PREDNISONE 10 MG PO TABS: 40.0000 mg | ORAL_TABLET | Freq: Every day | ORAL | 0 refills | Status: AC

## 2020-07-21 MED ORDER — FAMOTIDINE IN NACL 20-0.9 MG/50ML-% IV SOLN
20.0000 mg | Freq: Once | INTRAVENOUS | Status: AC
  Administered 2020-07-21: 20 mg via INTRAVENOUS
  Filled 2020-07-21: qty 50

## 2020-07-21 MED ORDER — EPINEPHRINE 0.3 MG/0.3ML IJ SOAJ: 0.3000 mg | INTRAMUSCULAR | 0 refills | Status: AC | PRN

## 2020-07-21 MED ORDER — DIPHENHYDRAMINE HCL 50 MG/ML IJ SOLN
25.0000 mg | Freq: Once | INTRAMUSCULAR | Status: AC
  Administered 2020-07-21: 25 mg via INTRAVENOUS
  Filled 2020-07-21: qty 1

## 2020-07-21 NOTE — ED Triage Notes (Signed)
Pt stung on L side approx 15 min ago while gardening.  C/o sore throat and difficulty breathing.  Speaking in complete sentences.

## 2020-07-21 NOTE — ED Provider Notes (Signed)
[facility] EMERGENCY DEPARTMENT Provider Note   CSN: [id number] Arrival date & time: 07/21/20  5462     History Chief Complaint  Patient presents with  . Allergic Reaction    Christy Harrington is a 60 y.o. female.  HPI Patient is a 60 year old female with past medical history significant for hypertension and thyroid disease presented today with sore throat, some mild shortness of breath, some nausea all which came on abruptly after she was stung by some insect while she was gardening 20 minutes prior to arrival in the ER.  She states she has no history of anaphylaxis.  She states she is uncertain what stung her but she felt that she was stung twice in the left side.  She denies any vomiting or diarrhea.  No rash apart from the areas where she was stung which she states she feels a small bump.  She states she also has a somewhat sore throat.  She states that it is irritated feeling.  Denies any chest pain lightheadedness or dizziness.  No other associate symptoms.  No medications prior to arrival.     Past Medical History:  Diagnosis Date  . Hypertension   . Thyroid disease     There are no problems to display for this patient.   History reviewed. No pertinent surgical history.   OB History   No obstetric history on file.     No family history on file.  Social History   Tobacco Use  . Smoking status: Never Smoker  Substance Use Topics  . Alcohol use: No    Home Medications Prior to Admission medications   Medication Sig Start Date End Date Taking? Authorizing Provider  acetaminophen (TYLENOL) 325 MG tablet Take 650 mg by mouth every 6 (six) hours as needed for mild pain, fever or headache.   Yes [provider]  amLODipine (NORVASC) 10 MG tablet Take 10 mg by mouth daily. 06/03/20  Yes [provider]  Ascorbic Acid (VITAMIN C PO) Take 1 tablet by mouth daily.   Yes [provider]  BIOTIN PO Take 1 tablet by mouth  daily.   Yes [provider]  EPINEPHrine 0.3 mg/0.3 mL IJ SOAJ injection Inject 0.3 mg into the muscle as needed for anaphylaxis. 07/21/20  Yes Jenille Laszlo S, PA  losartan (COZAAR) 100 MG tablet Take 100 mg by mouth daily. 06/12/20  Yes [provider]  MAGNESIUM PO Take 1 tablet by mouth daily.   Yes [provider]  Multiple Vitamin (MULTIVITAMIN WITH MINERALS) TABS tablet Take 1 tablet by mouth daily.   Yes [provider]  Multiple Vitamins-Minerals (ZINC PO) Take 1 tablet by mouth daily.   Yes [provider]  polyvinyl alcohol (LIQUIFILM TEARS) 1.4 % ophthalmic solution Place 1 drop into both eyes as needed for dry eyes.   Yes [provider]  predniSONE (DELTASONE) 10 MG tablet Take 4 tablets (40 mg total) by mouth daily with breakfast for 4 days. 07/21/20 07/25/20 Yes Kawon Willcutt S, PA  SYNTHROID 88 MCG tablet Take 88 mcg by mouth every morning. 07/04/20  Yes [provider]  VITAMIN D PO Take 1 capsule by mouth daily.   Yes [provider]    Allergies    Chia seed (salvia hispanica) [salvia hispanica] and Sulfa antibiotics  Review of Systems   Review of Systems  Constitutional: Negative for chills and fever.  HENT: Positive for sore throat. Negative for congestion.   Eyes: Negative  for pain.  Respiratory: Positive for shortness of breath. Negative for cough.   Cardiovascular: Negative for chest pain and leg swelling.  Gastrointestinal: Positive for nausea. Negative for abdominal pain, diarrhea and vomiting.  Genitourinary: Negative for dysuria.  Musculoskeletal: Negative for myalgias.  Skin: Negative for rash.  Neurological: Negative for dizziness and headaches.    Physical Exam Updated Vital Signs BP 134/70 (BP Location: Right Arm)   Pulse 72   Temp 98.6 F (37 C) (Oral)   Resp 20   SpO2 99%   Physical Exam Vitals and nursing note reviewed.  Constitutional:      General: She is not in acute  distress. HENT:     Head: Normocephalic and atraumatic.     Nose: Nose normal.     Mouth/Throat:     Comments: Tongue is not swollen. Speaking normally with normal phonation Managing secretions without difficulty Eyes:     General: No scleral icterus. Cardiovascular:     Rate and Rhythm: Normal rate and regular rhythm.     Pulses: Normal pulses.     Heart sounds: Normal heart sounds.     Comments: Heart rate 96 no murmurs rubs or gallops Pulmonary:     Effort: Pulmonary effort is normal. No respiratory distress.     Breath sounds: Normal breath sounds. No wheezing.     Comments: No increased work of breathing, no tachypnea, speaking in full sentences lungs are clear to auscultation Abdominal:     Palpations: Abdomen is soft.     Tenderness: There is no abdominal tenderness.  Musculoskeletal:     Cervical back: Normal range of motion.     Right lower leg: No edema.     Left lower leg: No edema.  Skin:    General: Skin is warm and dry.     Capillary Refill: Capillary refill takes less than 2 seconds.     Comments: 2 small hives that are raised approximately 7 mm in diameter and located on the left flank.  Neurological:     Mental Status: She is alert. Mental status is at baseline.  Psychiatric:        Mood and Affect: Mood normal.        Behavior: Behavior normal.     ED Results / Procedures / Treatments   Labs (all labs ordered are listed, but only abnormal results are displayed) Labs Reviewed  BASIC METABOLIC PANEL - Abnormal; Notable for the following components:      Result Value   Potassium 3.0 (*)    BUN 5 (*)    All other components within normal limits  CBC WITH DIFFERENTIAL/PLATELET    EKG None  Radiology DG Chest Portable 1 View  Result Date: 07/21/2020 CLINICAL DATA:  Shortness of breath EXAM: PORTABLE CHEST 1 VIEW COMPARISON:  None. FINDINGS: Heart size is upper limits of normal, which may be accentuated by AP semi upright technique. No pulmonary  vascular congestion. No focal airspace consolidation, pleural effusion, or pneumothorax. No acute osseous findings. IMPRESSION: No active disease. Electronically Signed   By: Duanne Guess D.O.   On: 07/21/2020 10:45    Procedures Procedures   Medications Ordered in ED Medications  potassium chloride SA (KLOR-CON) CR tablet 40 mEq (has no administration in time range)  methylPREDNISolone sodium succinate (SOLU-MEDROL) 125 mg/2 mL injection 125 mg (125 mg Intravenous Given 07/21/20 1022)  diphenhydrAMINE (BENADRYL) injection 25 mg (25 mg Intravenous Given 07/21/20 1024)  famotidine (PEPCID) IVPB 20 mg premix (0 mg Intravenous  Stopped 07/21/20 1123)  ondansetron (ZOFRAN) injection 4 mg (4 mg Intravenous Given 07/21/20 1023)  sodium chloride 0.9 % bolus 1,000 mL (0 mLs Intravenous Stopped 07/21/20 1058)    ED Course  I have reviewed the triage vital signs and the nursing notes.  Pertinent labs & imaging results that were available during my care of the patient were reviewed by me and considered in my medical decision making (see chart for details).  Clinical Course as of 07/21/20 1320  Sun Jul 21, 2020  1010 Patient was stung 3 times in her garden by an unknown type of insect.  She states that this occurred about 20 minutes ago from this moment.  So approximately 9:50 AM.  She states she came immediately to the ER because she started feeling like she was having some trouble swallowing and feels somewhat short of breath also endorses some nausea.  On physical exam patient is breathing at a normal rate lungs are clear to auscultation with no wheezing.  No abdominal tenderness.  She does have 2 hive appearing areas to her left hip/left flank.  She is tolerating secretions without difficulty.  Will provide Solu-Medrol, Benadryl and famotidine IV fluids and obtain basic labs.  Will have low threshold for epinephrine should she worsen. [WF]    Clinical Course User Index [WF] Gailen Shelter, [place]    Patient is overall well-appearing on initial evaluation.  Did not need epinephrine at this time.  Will obtain labs, EKG and give Solu-Medrol Benadryl and Pepcid.  MDM Rules/Calculators/A&P                         CBC without leukocytosis or anemia. Potassium 3.0 this is a normal for her.  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  We will provide 1 tablet of p.o. potassium here.    She was p.o. challenged without difficulty.  Tolerating fluid well.  States she feels significantly improved.    Will discharge with epinephrine pen in case needed and she was counseled on necessity of quick return to ER for this if she had to use it.  Discharged with prednisone and Benadryl recommendations.  Strict return precautions.  Final Clinical Impression(s) / ED Diagnoses Final diagnoses:  Allergic reaction, initial encounter  Hypokalemia    Rx / DC Orders ED Discharge Orders         Ordered    EPINEPHrine 0.3 mg/0.3 mL IJ SOAJ injection  As needed        07/21/20 1307    predniSONE (DELTASONE) 10 MG tablet  Daily with breakfast        07/21/20 1307           Gailen Shelter, PA 07/21/20 1322    Rozelle Logan, DO 07/21/20 2118

## 2020-07-21 NOTE — Discharge Instructions (Addendum)
Given the severity of your reaction today and your borderline for criteria for anaphylaxis.  I am prescribing you an epinephrine pen please talk to the pharmacist about how to administer this.  Please take Benadryl 25 mg either every 6 hours or 50 mg at nighttime for the next few days as needed for itching.  Please take the prednisone I prescribed daily.  Please follow-up with your primary care provider.  I have also given you the information for an allergist in [place].  If you have any new or concerning symptoms return to ER.  If you have to administer your epinephrine pen please come to the ER immediately.

## 2020-08-02 NOTE — Progress Notes (Signed)
  Subjective:  Patient ID: Christy Harrington, female    DOB: 1961-03-17,  MRN: [id number]  Chief Complaint  Patient presents with  . Foot Pain    Bilateral foot pain. Left arch and right heel pain x 2 months.     60 y.o. female presents with the above complaint. History confirmed with patient.   Objective:  Physical Exam: warm, good capillary refill, no trophic changes or ulcerative lesions, normal DP and PT pulses and normal sensory exam. Left Foot: tenderness to palpation medial calcaneal tuber, no pain with calcaneal squeeze, decreased ankle joint ROM and +Silverskiold test Right Foot: tenderness to palpation medial calcaneal tuber, no pain with calcaneal squeeze, decreased ankle joint ROM and +Silverskiold test normal exam, no swelling, tenderness, instability; ligaments intact, full range of motion of all ankle/foot joints other than findings noted above.  Radiographs: X-ray of both feet: no evidence of calcaneal stress fracture, plantar calcaneal spur, posterior calcaneal spur and Haglund deformity noted  Assessment:   1. Plantar fasciitis   2. Equinus deformity of foot      Plan:  Patient was evaluated and treated and all questions answered.  Plantar Fasciitis -XR reviewed with patient -Educated patient on stretching and icing of the affected limb   No follow-ups on file.

## 2020-12-01 DIAGNOSIS — Z20822 Contact with and (suspected) exposure to covid-19: Secondary | ICD-10-CM | POA: Diagnosis not present

## 2020-12-01 DIAGNOSIS — J0140 Acute pansinusitis, unspecified: Secondary | ICD-10-CM | POA: Diagnosis not present

## 2020-12-01 DIAGNOSIS — Z03818 Encounter for observation for suspected exposure to other biological agents ruled out: Secondary | ICD-10-CM | POA: Diagnosis not present

## 2020-12-01 DIAGNOSIS — H60392 Other infective otitis externa, left ear: Secondary | ICD-10-CM | POA: Diagnosis not present

## 2021-01-17 DIAGNOSIS — H53413 Scotoma involving central area, bilateral: Secondary | ICD-10-CM | POA: Diagnosis not present

## 2021-01-17 DIAGNOSIS — H2513 Age-related nuclear cataract, bilateral: Secondary | ICD-10-CM | POA: Diagnosis not present

## 2021-01-31 DIAGNOSIS — H2511 Age-related nuclear cataract, right eye: Secondary | ICD-10-CM | POA: Diagnosis not present

## 2021-01-31 DIAGNOSIS — H2513 Age-related nuclear cataract, bilateral: Secondary | ICD-10-CM | POA: Diagnosis not present

## 2021-02-13 DIAGNOSIS — H66001 Acute suppurative otitis media without spontaneous rupture of ear drum, right ear: Secondary | ICD-10-CM | POA: Diagnosis not present

## 2021-03-31 DIAGNOSIS — U071 COVID-19: Secondary | ICD-10-CM | POA: Diagnosis not present

## 2021-05-31 IMAGING — DX DG CHEST 1V PORT
1 series · 1 of 1 positions shown · non-contrast
Comparison: None.

CLINICAL DATA: Shortness of breath

EXAM:
PORTABLE CHEST 1 VIEW

[chest ap]
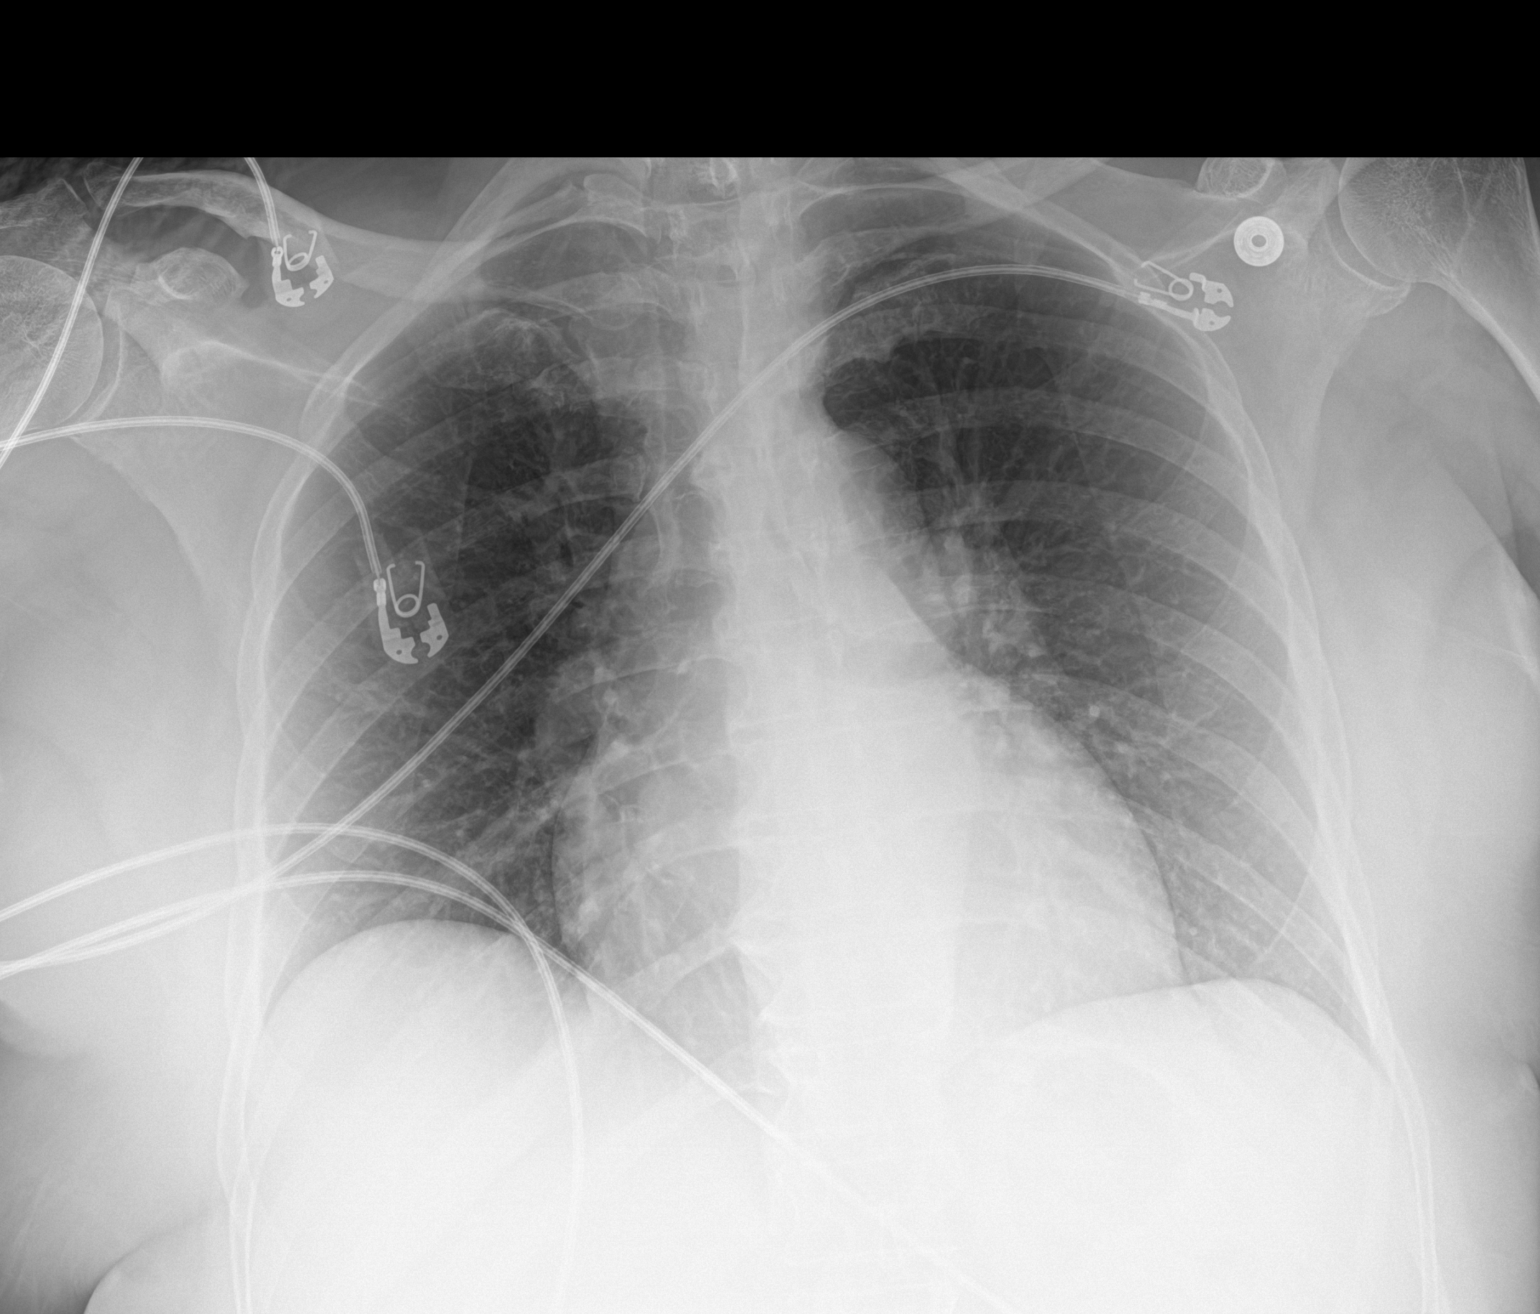

[1 of 1 positions shown; findings below may reference images not displayed]

FINDINGS: Heart size is upper limits of normal, which may be accentuated by AP
semi upright technique. No pulmonary vascular congestion. No focal
airspace consolidation, pleural effusion, or pneumothorax. No acute
osseous findings.
IMPRESSION: No active disease.

## 2023-06-09 DIAGNOSIS — Z6828 Body mass index (BMI) 28.0-28.9, adult: Secondary | ICD-10-CM | POA: Diagnosis not present

## 2023-06-09 DIAGNOSIS — I10 Essential (primary) hypertension: Secondary | ICD-10-CM | POA: Diagnosis not present

## 2023-06-09 DIAGNOSIS — E663 Overweight: Secondary | ICD-10-CM | POA: Diagnosis not present

## 2023-06-09 DIAGNOSIS — Z8639 Personal history of other endocrine, nutritional and metabolic disease: Secondary | ICD-10-CM | POA: Diagnosis not present

## 2023-06-09 DIAGNOSIS — E039 Hypothyroidism, unspecified: Secondary | ICD-10-CM | POA: Diagnosis not present

## 2023-06-09 DIAGNOSIS — Z008 Encounter for other general examination: Secondary | ICD-10-CM | POA: Diagnosis not present

## 2023-06-09 DIAGNOSIS — Z13220 Encounter for screening for lipoid disorders: Secondary | ICD-10-CM | POA: Diagnosis not present

## 2023-06-09 DIAGNOSIS — R7309 Other abnormal glucose: Secondary | ICD-10-CM | POA: Diagnosis not present

## 2023-06-16 DIAGNOSIS — Z9109 Other allergy status, other than to drugs and biological substances: Secondary | ICD-10-CM | POA: Diagnosis not present

## 2023-06-16 DIAGNOSIS — E032 Hypothyroidism due to medicaments and other exogenous substances: Secondary | ICD-10-CM | POA: Diagnosis not present

## 2023-06-16 DIAGNOSIS — Z8639 Personal history of other endocrine, nutritional and metabolic disease: Secondary | ICD-10-CM | POA: Diagnosis not present

## 2023-06-16 DIAGNOSIS — Z79899 Other long term (current) drug therapy: Secondary | ICD-10-CM | POA: Diagnosis not present

## 2023-06-16 DIAGNOSIS — Z0000 Encounter for general adult medical examination without abnormal findings: Secondary | ICD-10-CM | POA: Diagnosis not present

## 2023-06-16 DIAGNOSIS — R7309 Other abnormal glucose: Secondary | ICD-10-CM | POA: Diagnosis not present

## 2023-06-16 DIAGNOSIS — I10 Essential (primary) hypertension: Secondary | ICD-10-CM | POA: Diagnosis not present

## 2023-06-16 DIAGNOSIS — Z91030 Bee allergy status: Secondary | ICD-10-CM | POA: Diagnosis not present

## 2023-07-20 DIAGNOSIS — H6122 Impacted cerumen, left ear: Secondary | ICD-10-CM | POA: Diagnosis not present

## 2023-07-20 DIAGNOSIS — H6692 Otitis media, unspecified, left ear: Secondary | ICD-10-CM | POA: Diagnosis not present

## 2023-07-20 DIAGNOSIS — Z20822 Contact with and (suspected) exposure to covid-19: Secondary | ICD-10-CM | POA: Diagnosis not present

## 2023-07-20 DIAGNOSIS — R059 Cough, unspecified: Secondary | ICD-10-CM | POA: Diagnosis not present

## 2023-09-12 DIAGNOSIS — R059 Cough, unspecified: Secondary | ICD-10-CM | POA: Diagnosis not present

## 2023-09-12 DIAGNOSIS — Z20822 Contact with and (suspected) exposure to covid-19: Secondary | ICD-10-CM | POA: Diagnosis not present

## 2023-09-12 DIAGNOSIS — J019 Acute sinusitis, unspecified: Secondary | ICD-10-CM | POA: Diagnosis not present

## 2023-09-17 DIAGNOSIS — I10 Essential (primary) hypertension: Secondary | ICD-10-CM | POA: Diagnosis not present

## 2023-09-17 DIAGNOSIS — R519 Headache, unspecified: Secondary | ICD-10-CM | POA: Diagnosis not present

## 2023-10-22 DIAGNOSIS — M47816 Spondylosis without myelopathy or radiculopathy, lumbar region: Secondary | ICD-10-CM | POA: Diagnosis not present

## 2023-10-22 DIAGNOSIS — Z6829 Body mass index (BMI) 29.0-29.9, adult: Secondary | ICD-10-CM | POA: Diagnosis not present

## 2023-11-09 DIAGNOSIS — L728 Other follicular cysts of the skin and subcutaneous tissue: Secondary | ICD-10-CM | POA: Diagnosis not present

## 2023-11-09 DIAGNOSIS — R531 Weakness: Secondary | ICD-10-CM | POA: Diagnosis not present

## 2023-11-09 DIAGNOSIS — M545 Low back pain, unspecified: Secondary | ICD-10-CM | POA: Diagnosis not present

## 2023-11-11 DIAGNOSIS — R531 Weakness: Secondary | ICD-10-CM | POA: Diagnosis not present

## 2023-11-11 DIAGNOSIS — M545 Low back pain, unspecified: Secondary | ICD-10-CM | POA: Diagnosis not present

## 2023-11-15 DIAGNOSIS — R531 Weakness: Secondary | ICD-10-CM | POA: Diagnosis not present

## 2023-11-15 DIAGNOSIS — M545 Low back pain, unspecified: Secondary | ICD-10-CM | POA: Diagnosis not present

## 2023-11-17 DIAGNOSIS — R531 Weakness: Secondary | ICD-10-CM | POA: Diagnosis not present

## 2023-11-17 DIAGNOSIS — M545 Low back pain, unspecified: Secondary | ICD-10-CM | POA: Diagnosis not present

## 2023-11-22 DIAGNOSIS — R531 Weakness: Secondary | ICD-10-CM | POA: Diagnosis not present

## 2023-11-22 DIAGNOSIS — M545 Low back pain, unspecified: Secondary | ICD-10-CM | POA: Diagnosis not present

## 2023-11-24 DIAGNOSIS — R531 Weakness: Secondary | ICD-10-CM | POA: Diagnosis not present

## 2023-11-24 DIAGNOSIS — M545 Low back pain, unspecified: Secondary | ICD-10-CM | POA: Diagnosis not present

## 2023-12-01 DIAGNOSIS — R531 Weakness: Secondary | ICD-10-CM | POA: Diagnosis not present

## 2023-12-01 DIAGNOSIS — M545 Low back pain, unspecified: Secondary | ICD-10-CM | POA: Diagnosis not present

## 2023-12-08 DIAGNOSIS — R531 Weakness: Secondary | ICD-10-CM | POA: Diagnosis not present

## 2023-12-08 DIAGNOSIS — M545 Low back pain, unspecified: Secondary | ICD-10-CM | POA: Diagnosis not present

## 2023-12-13 DIAGNOSIS — R531 Weakness: Secondary | ICD-10-CM | POA: Diagnosis not present

## 2023-12-13 DIAGNOSIS — M545 Low back pain, unspecified: Secondary | ICD-10-CM | POA: Diagnosis not present

## 2023-12-16 DIAGNOSIS — M545 Low back pain, unspecified: Secondary | ICD-10-CM | POA: Diagnosis not present

## 2023-12-16 DIAGNOSIS — R531 Weakness: Secondary | ICD-10-CM | POA: Diagnosis not present

## 2023-12-20 DIAGNOSIS — M545 Low back pain, unspecified: Secondary | ICD-10-CM | POA: Diagnosis not present

## 2023-12-20 DIAGNOSIS — R531 Weakness: Secondary | ICD-10-CM | POA: Diagnosis not present

## 2023-12-27 DIAGNOSIS — R531 Weakness: Secondary | ICD-10-CM | POA: Diagnosis not present

## 2023-12-27 DIAGNOSIS — M545 Low back pain, unspecified: Secondary | ICD-10-CM | POA: Diagnosis not present

## 2024-01-05 DIAGNOSIS — M545 Low back pain, unspecified: Secondary | ICD-10-CM | POA: Diagnosis not present

## 2024-01-05 DIAGNOSIS — R531 Weakness: Secondary | ICD-10-CM | POA: Diagnosis not present

## 2024-01-07 DIAGNOSIS — Z6830 Body mass index (BMI) 30.0-30.9, adult: Secondary | ICD-10-CM | POA: Diagnosis not present

## 2024-01-07 DIAGNOSIS — M47816 Spondylosis without myelopathy or radiculopathy, lumbar region: Secondary | ICD-10-CM | POA: Diagnosis not present
